# Patient Record
Sex: Male | Born: 1970 | Race: Black or African American | Hispanic: No | Marital: Single | Smoking: Current every day smoker
Health system: Southern US, Community
[De-identification: ages and names within clinical notes are randomized; demographics above are authoritative.]

---

## 2004-03-04 ENCOUNTER — Emergency Department: Payer: Self-pay | Admitting: Emergency Medicine

## 2005-08-10 ENCOUNTER — Emergency Department: Payer: Self-pay | Admitting: Emergency Medicine

## 2005-10-30 ENCOUNTER — Emergency Department: Payer: Self-pay | Admitting: Emergency Medicine

## 2005-11-11 ENCOUNTER — Emergency Department: Payer: Self-pay | Admitting: Unknown Physician Specialty

## 2006-01-20 ENCOUNTER — Emergency Department: Payer: Self-pay | Admitting: Internal Medicine

## 2006-03-12 ENCOUNTER — Emergency Department: Payer: Self-pay | Admitting: Internal Medicine

## 2006-11-22 ENCOUNTER — Emergency Department: Payer: Self-pay | Admitting: Unknown Physician Specialty

## 2007-05-16 ENCOUNTER — Emergency Department: Payer: Self-pay | Admitting: Emergency Medicine

## 2008-02-03 ENCOUNTER — Emergency Department: Payer: Self-pay | Admitting: Emergency Medicine

## 2008-02-07 ENCOUNTER — Emergency Department: Payer: Self-pay | Admitting: Emergency Medicine

## 2008-03-17 ENCOUNTER — Emergency Department: Payer: Self-pay | Admitting: Internal Medicine

## 2008-10-19 ENCOUNTER — Emergency Department: Payer: Self-pay | Admitting: Internal Medicine

## 2008-11-27 ENCOUNTER — Emergency Department: Payer: Self-pay | Admitting: Emergency Medicine

## 2008-12-17 ENCOUNTER — Emergency Department: Payer: Self-pay | Admitting: Emergency Medicine

## 2008-12-19 ENCOUNTER — Ambulatory Visit: Payer: Self-pay | Admitting: Internal Medicine

## 2010-10-10 ENCOUNTER — Emergency Department: Payer: Self-pay | Admitting: Emergency Medicine

## 2012-09-23 ENCOUNTER — Emergency Department: Payer: Self-pay | Admitting: Emergency Medicine

## 2012-12-06 ENCOUNTER — Emergency Department: Payer: Self-pay | Admitting: Emergency Medicine

## 2012-12-07 LAB — GC/CHLAMYDIA PROBE AMP

## 2013-05-20 ENCOUNTER — Emergency Department: Payer: Self-pay | Admitting: Emergency Medicine

## 2014-07-03 ENCOUNTER — Emergency Department: Payer: Self-pay | Admitting: Emergency Medicine

## 2014-09-20 ENCOUNTER — Emergency Department: Admit: 2014-09-20 | Discharge status: Against Medical Advice | Payer: Self-pay | Admitting: Emergency Medicine

## 2014-09-20 LAB — BASIC METABOLIC PANEL
Anion Gap: 9 (ref 7–16)
BUN: 7 mg/dL
CALCIUM: 8.8 mg/dL — AB
CHLORIDE: 105 mmol/L
Co2: 25 mmol/L
Creatinine: 0.79 mg/dL
EGFR (Non-African Amer.): >60
GLUCOSE: 99 mg/dL
Potassium: 3.6 mmol/L
Sodium: 139 mmol/L

## 2014-09-20 LAB — CBC WITH DIFFERENTIAL/PLATELET
BASOS PCT: 1 %
Basophil #: 0.1 10*3/uL (ref 0.0–0.1)
EOS PCT: 1.5 %
Eosinophil #: 0.1 10*3/uL (ref 0.0–0.7)
HCT: 48.8 % (ref 40.0–52.0)
HGB: 16.8 g/dL (ref 13.0–18.0)
LYMPHS PCT: 32.7 %
Lymphocyte #: 2.7 10*3/uL (ref 1.0–3.6)
MCH: 29.4 pg (ref 26.0–34.0)
MCHC: 34.4 g/dL (ref 32.0–36.0)
MCV: 85 fL (ref 80–100)
MONOS PCT: 7.9 %
Monocyte #: 0.7 x10 3/mm (ref 0.2–1.0)
Neutrophil #: 4.7 10*3/uL (ref 1.4–6.5)
Neutrophil %: 56.9 %
Platelet: 227 10*3/uL (ref 150–440)
RBC: 5.72 10*6/uL (ref 4.40–5.90)
RDW: 14.6 % — ABNORMAL HIGH (ref 11.5–14.5)
WBC: 8.3 10*3/uL (ref 3.8–10.6)

## 2014-09-20 LAB — TROPONIN I: Troponin-I: <0.03 ng/mL

## 2014-09-24 ENCOUNTER — Emergency Department
Admit: 2014-09-24 | Disposition: A | Discharge status: Home / Self Care | Payer: Self-pay | Admitting: Emergency Medicine

## 2015-08-03 ENCOUNTER — Encounter: Payer: Self-pay | Admitting: Emergency Medicine

## 2015-08-03 ENCOUNTER — Emergency Department
Admission: EM | Admit: 2015-08-03 | Discharge: 2015-08-03 | Disposition: A | Discharge status: Home / Self Care | Payer: Medicare Other | Attending: Emergency Medicine | Admitting: Emergency Medicine

## 2015-08-03 DIAGNOSIS — L089 Local infection of the skin and subcutaneous tissue, unspecified: Secondary | ICD-10-CM | POA: Diagnosis not present

## 2015-08-03 DIAGNOSIS — R030 Elevated blood-pressure reading, without diagnosis of hypertension: Secondary | ICD-10-CM | POA: Diagnosis not present

## 2015-08-03 DIAGNOSIS — F17210 Nicotine dependence, cigarettes, uncomplicated: Secondary | ICD-10-CM | POA: Insufficient documentation

## 2015-08-03 DIAGNOSIS — L989 Disorder of the skin and subcutaneous tissue, unspecified: Secondary | ICD-10-CM | POA: Diagnosis present

## 2015-08-03 DIAGNOSIS — B9689 Other specified bacterial agents as the cause of diseases classified elsewhere: Secondary | ICD-10-CM

## 2015-08-03 MED ORDER — OXYCODONE-ACETAMINOPHEN 5-325 MG PO TABS: 1.0000 | ORAL_TABLET | Freq: Four times a day (QID) | ORAL | Status: DC | PRN

## 2015-08-03 MED ORDER — SULFAMETHOXAZOLE-TRIMETHOPRIM 800-160 MG PO TABS: 1.0000 | ORAL_TABLET | Freq: Two times a day (BID) | ORAL | Status: DC

## 2015-08-03 NOTE — ED Provider Notes (Signed)
[facility] Emergency Department Provider Note  ____________________________________________  Time seen: Approximately 12:19 PM  I have reviewed the triage vital signs and the nursing notes.   HISTORY  Chief Complaint Abscess    HPI Howard Jennings is a 45 y.o. male patient complaining of a knot in his right groin area. He states for 2-3 days he has noticed drainage from the area. Patient denies any fever or chills associated with this complaint. No palliative measures taken for this complaint.   History reviewed. No pertinent past medical history.  There are no active problems to display for this patient.   History reviewed. No pertinent past surgical history.  Current Outpatient Rx  Name  Route  Sig  Dispense  Refill  . oxyCODONE-acetaminophen (ROXICET) 5-325 MG tablet   Oral   Take 1 tablet by mouth every 6 (six) hours as needed for moderate pain.   12 tablet   0   . sulfamethoxazole-trimethoprim (BACTRIM DS,SEPTRA DS) 800-160 MG tablet   Oral   Take 1 tablet by mouth 2 (two) times daily.   20 tablet   0     Allergies Review of patient's allergies indicates no known allergies.  History reviewed. No pertinent family history.  Social History Social History  Substance Use Topics  . Smoking status: Current Every Day Smoker -- 0.50 packs/day    Types: Cigarettes  . Smokeless tobacco: None  . Alcohol Use: No    Review of Systems Constitutional: No fever/chills Eyes: No visual changes. ENT: No sore throat. Cardiovascular: Denies chest pain. Respiratory: Denies shortness of breath. Gastrointestinal: No abdominal pain.  No nausea, no vomiting.  No diarrhea.  No constipation. Genitourinary: Negative for dysuria. Musculoskeletal: Negative for back pain. Skin: Negative for rash. Nodule lesion right side of scrotum Neurological: Negative for headaches, focal weakness or  numbness.    ____________________________________________   PHYSICAL EXAM:  VITAL SIGNS: ED Triage Vitals  Enc Vitals Group     BP 08/03/15 1120 174/114 mmHg     Pulse Rate 08/03/15 1120 90     Resp 08/03/15 1120 18     Temp 08/03/15 1120 98.3 F (36.8 C)     Temp src --      SpO2 08/03/15 1120 94 %     Weight --      Height --      Head Cir --      Peak Flow --      Pain Score 08/03/15 1117 10     Pain Loc --      Pain Edu? --      Excl. in [place]? --     Constitutional: Alert and oriented. Well appearing and in no acute distress. Eyes: Conjunctivae are normal. PERRL. EOMI. Head: Atraumatic. Nose: No congestion/rhinnorhea. Mouth/Throat: Mucous membranes are moist.  Oropharynx non-erythematous. Neck: No stridor. No cervical spine tenderness to palpation. Hematological/Lymphatic/Immunilogical: No cervical lymphadenopathy. Cardiovascular: Normal rate, regular rhythm. Grossly normal heart sounds.  Good peripheral circulation. Elevated blood pressure Respiratory: Normal respiratory effort.  No retractions. Lungs CTAB. Gastrointestinal: Soft and nontender. No distention. No abdominal bruits. No CVA tenderness. Musculoskeletal: No lower extremity tenderness nor edema.  No joint effusions. Neurologic:  Normal speech and language. No gross focal neurologic deficits are appreciated. No gait instability. Skin:  Skin is warm, dry and intact. No rash noted. Ruptured nodule lesion right side of scrotum. Psychiatric: Mood and affect are normal. Speech and behavior are normal.  ____________________________________________   LABS (all labs ordered  are listed, but only abnormal results are displayed)  Labs Reviewed - No data to display ____________________________________________  EKG   ____________________________________________  RADIOLOGY   ____________________________________________   PROCEDURES  Procedure(s) performed: None  Critical Care performed:  No  ____________________________________________   INITIAL IMPRESSION / ASSESSMENT AND PLAN / ED COURSE  Pertinent labs & imaging results that were available during my care of the patient were reviewed by me and considered in my medical decision making (see chart for details).  Bacterial skin infection. Patient given discharge care instructions. Patient given prescription for tramadol and Bactrim DS. Patient advised return to ER if condition worsens. Advised 3 day [facility]. ____________________________________________   FINAL CLINICAL IMPRESSION(S) / ED DIAGNOSES  Final diagnoses:  Localized bacterial skin infection      Sable Feil, PA-C 08/03/15 1231  Eula Listen, MD 08/03/15 1534

## 2015-08-03 NOTE — ED Notes (Signed)
Pt reports 2-3 days of abscess in groin.

## 2015-08-03 NOTE — ED Notes (Signed)
Pt reports pain and "knot" in his right groin area. Pt states it has been there for 2-3 days. C/O pain 10/10, requests medication for pain. Pt alert & oriented with NAD noted. Pt sitting quietly in chair.

## 2015-08-03 NOTE — Discharge Instructions (Signed)
Apply warm compresses to the area 4 times a day for approximately 5 minutes.

## 2016-03-01 IMAGING — CR FACIAL BONES - 1-2 VIEW
1 series · 3 of 3 positions shown · non-contrast
Comparison: None.

CLINICAL DATA: Fall. Head struck on air condition a unit. Upper
maxillary pain.

EXAM:
FACIAL BONES - 1-2 VIEW

[Series 1: dxr facial bones limited · 0.14mm/px · 3 of 3 slices shown]
[im 1/3]
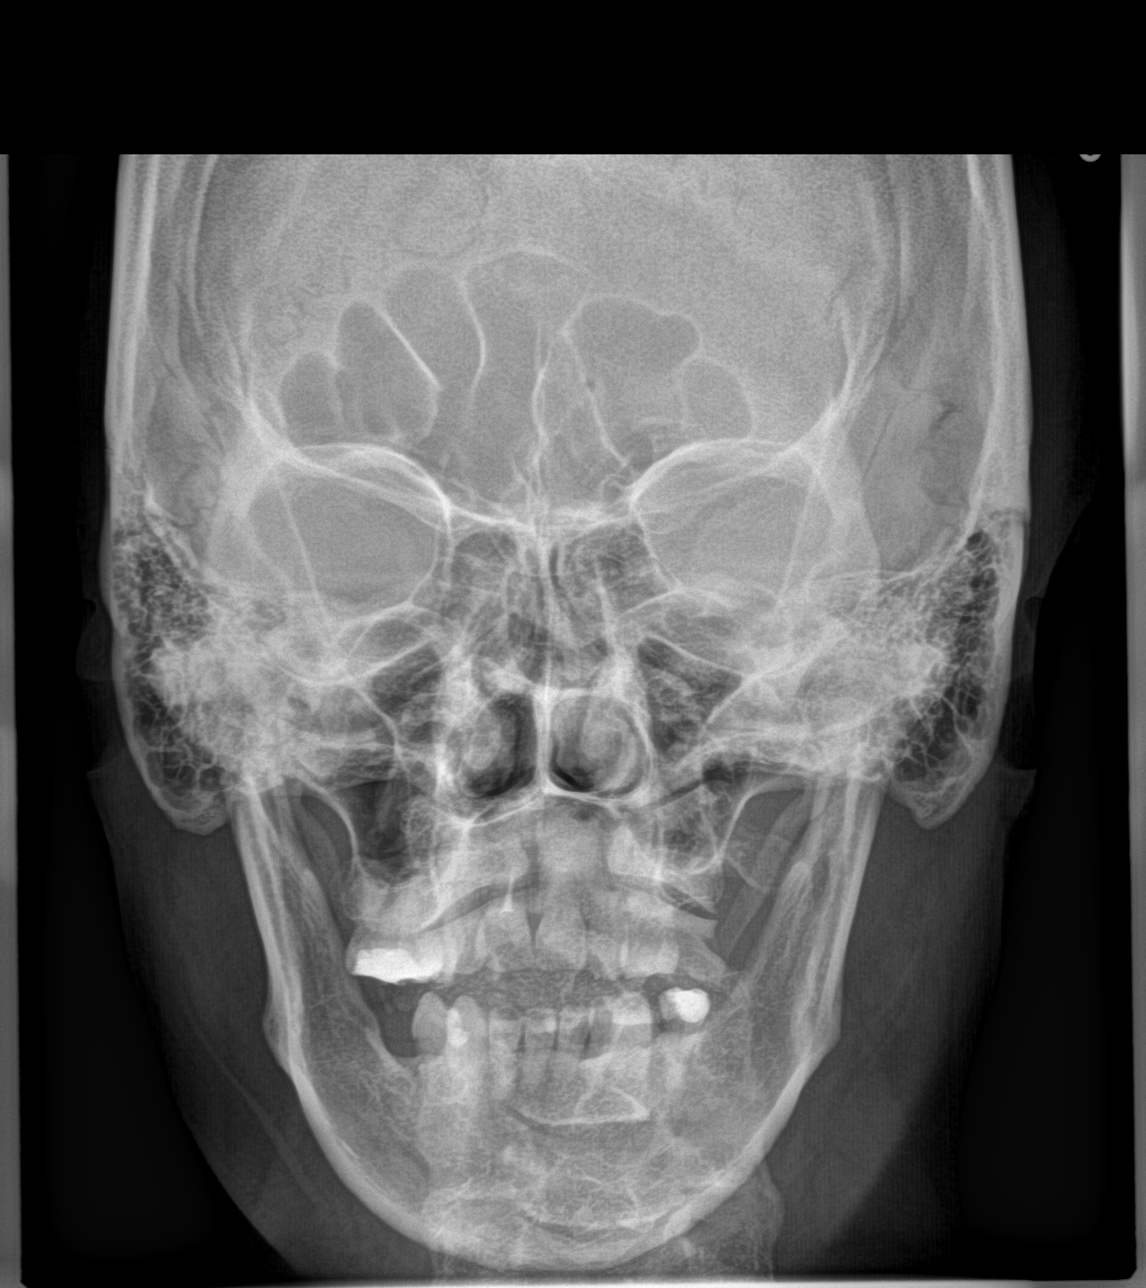
[im 2/3]
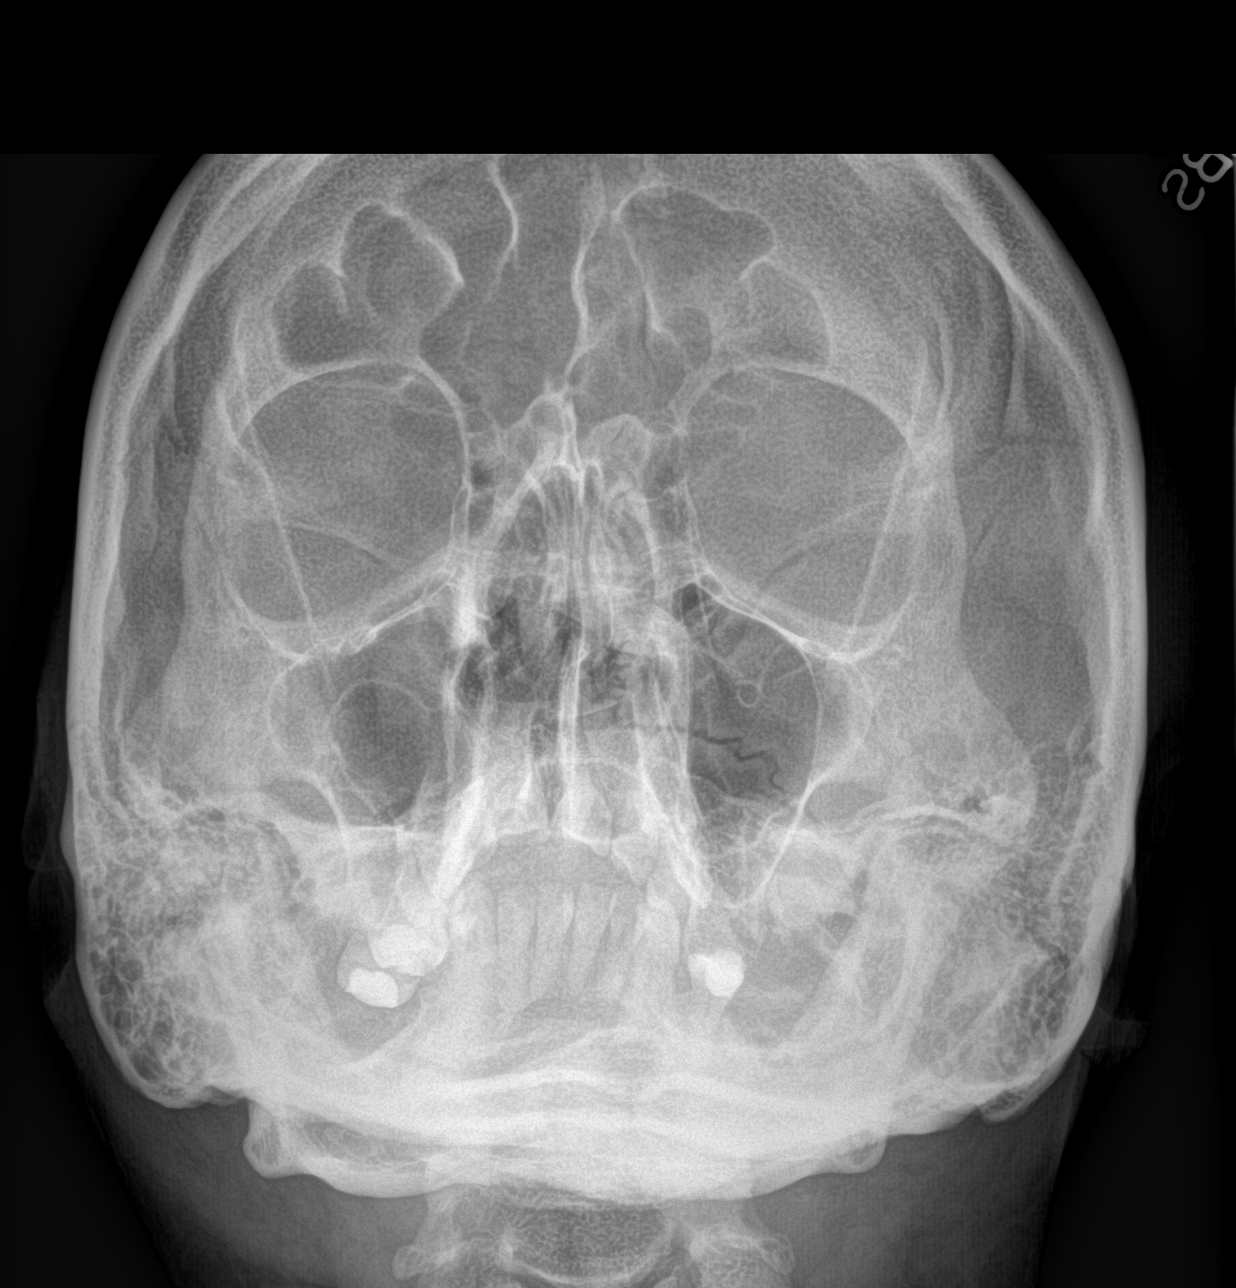
[im 3/3]
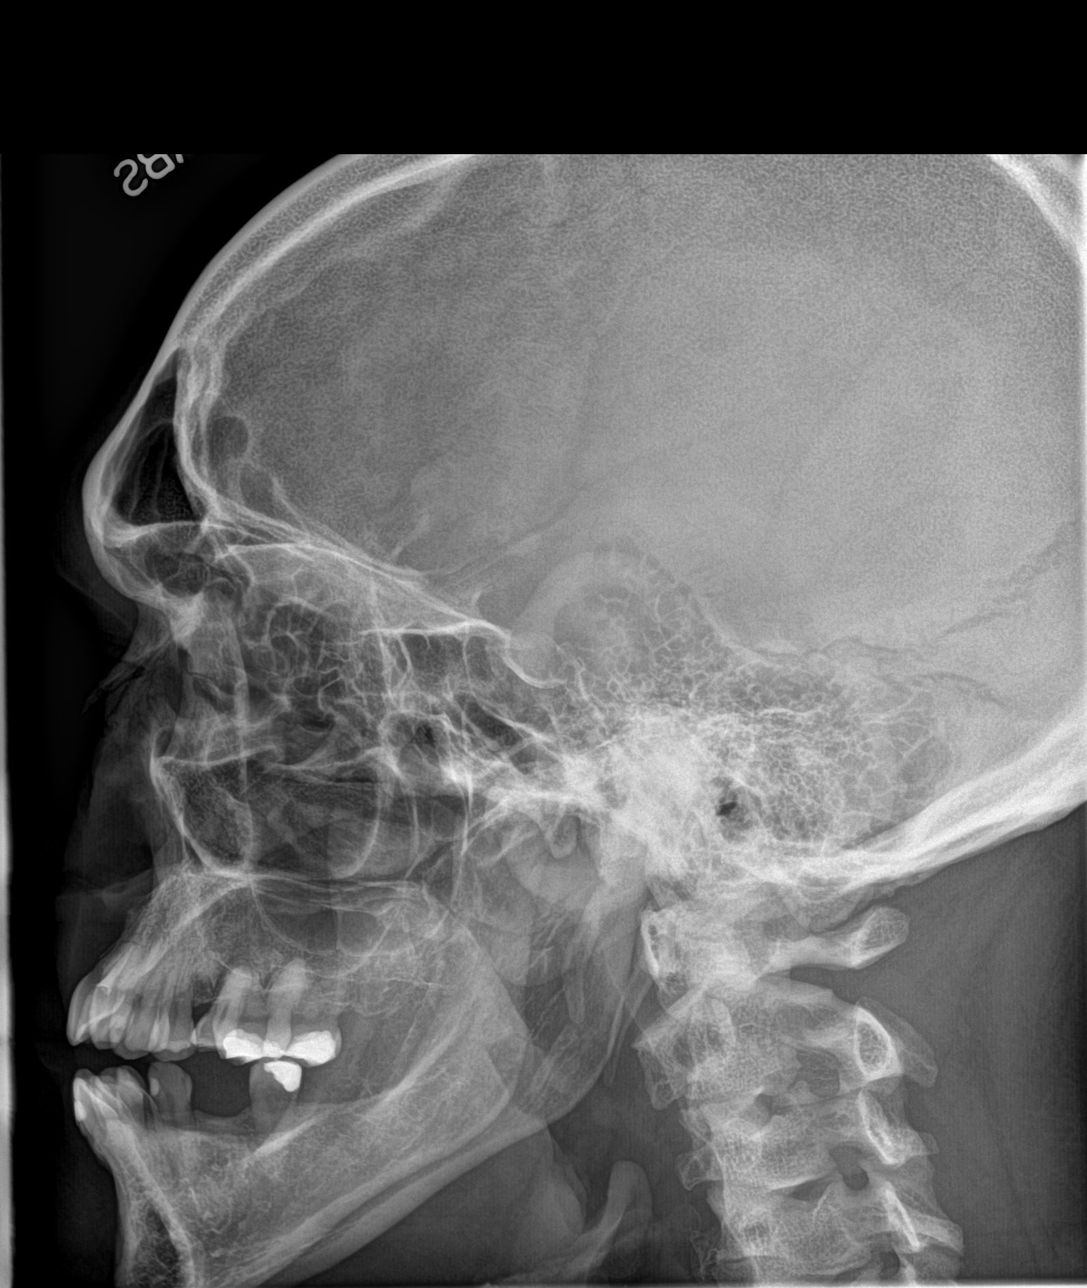

[3 of 3 positions shown; findings below may reference images not displayed]

FINDINGS: No visible fluid in the paranasal sinuses. Lucencies project over
the nasal bones on the lateral projection and are essentially
indeterminate for fracture. No other bony discontinuity observed.
IMPRESSION: 1. Lucencies along the nasal bone are probably technique related,
and less likely to be due to fracture.
2. Although I do not see a definite maxillary fracture, facial
radiographs have dramatically lower sensitivity for facial fractures
as compared to maxillofacial CT.

## 2016-04-30 IMAGING — CR DG LUMBAR SPINE 2-3V
1 series · 3 of 3 positions shown · non-contrast
Comparison: None.

CLINICAL DATA: Lower back pain radiating to right leg for 2 days.
Initial encounter.

EXAM:
LUMBAR SPINE - 2-3 VIEW

[Series 1: t lumbar spine ap · 0.14mm/px · 3 of 3 slices shown]
[im 1/3]
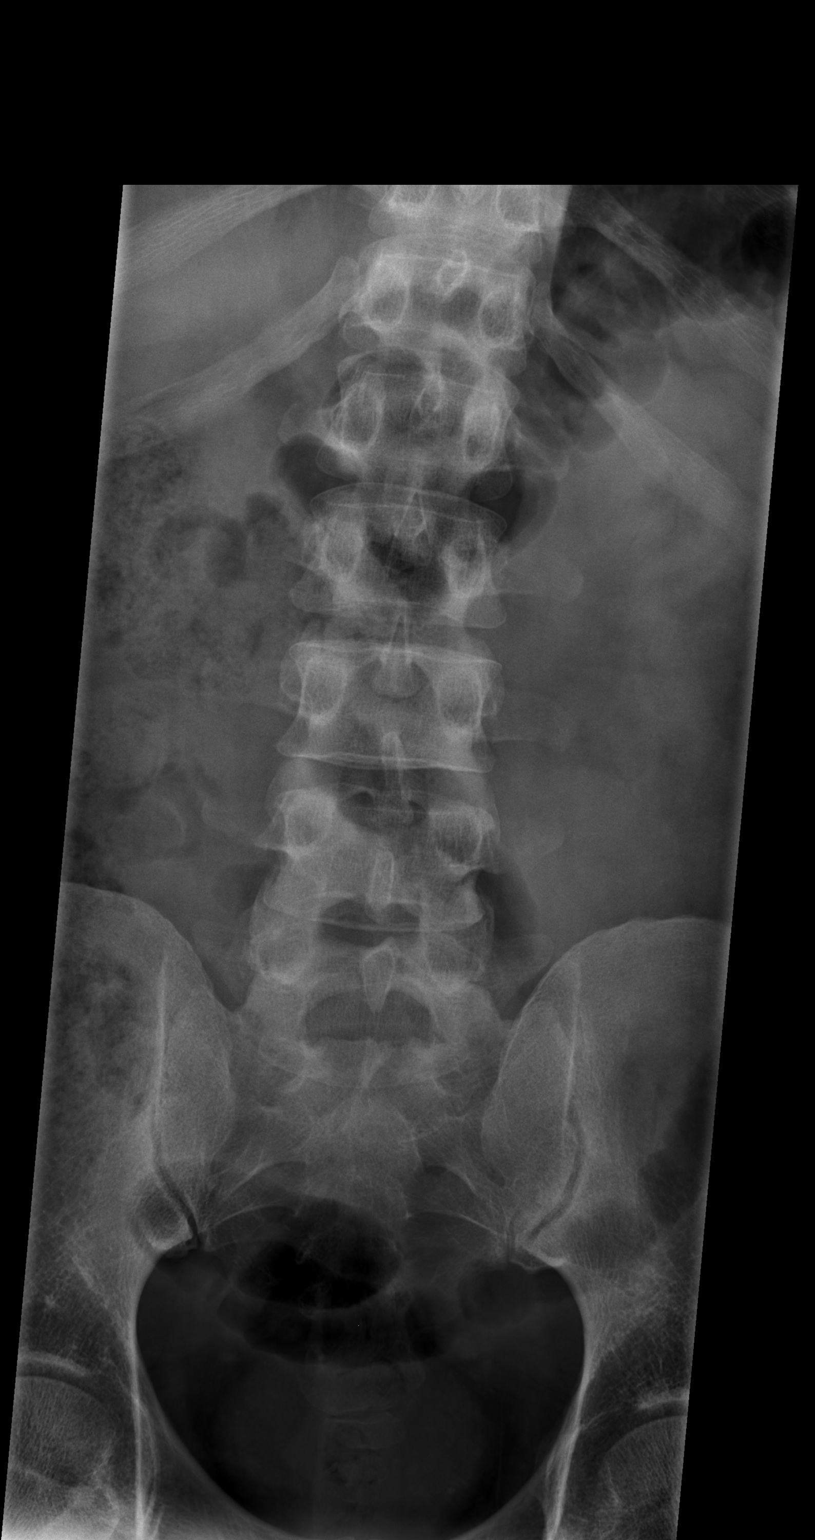
[im 2/3]
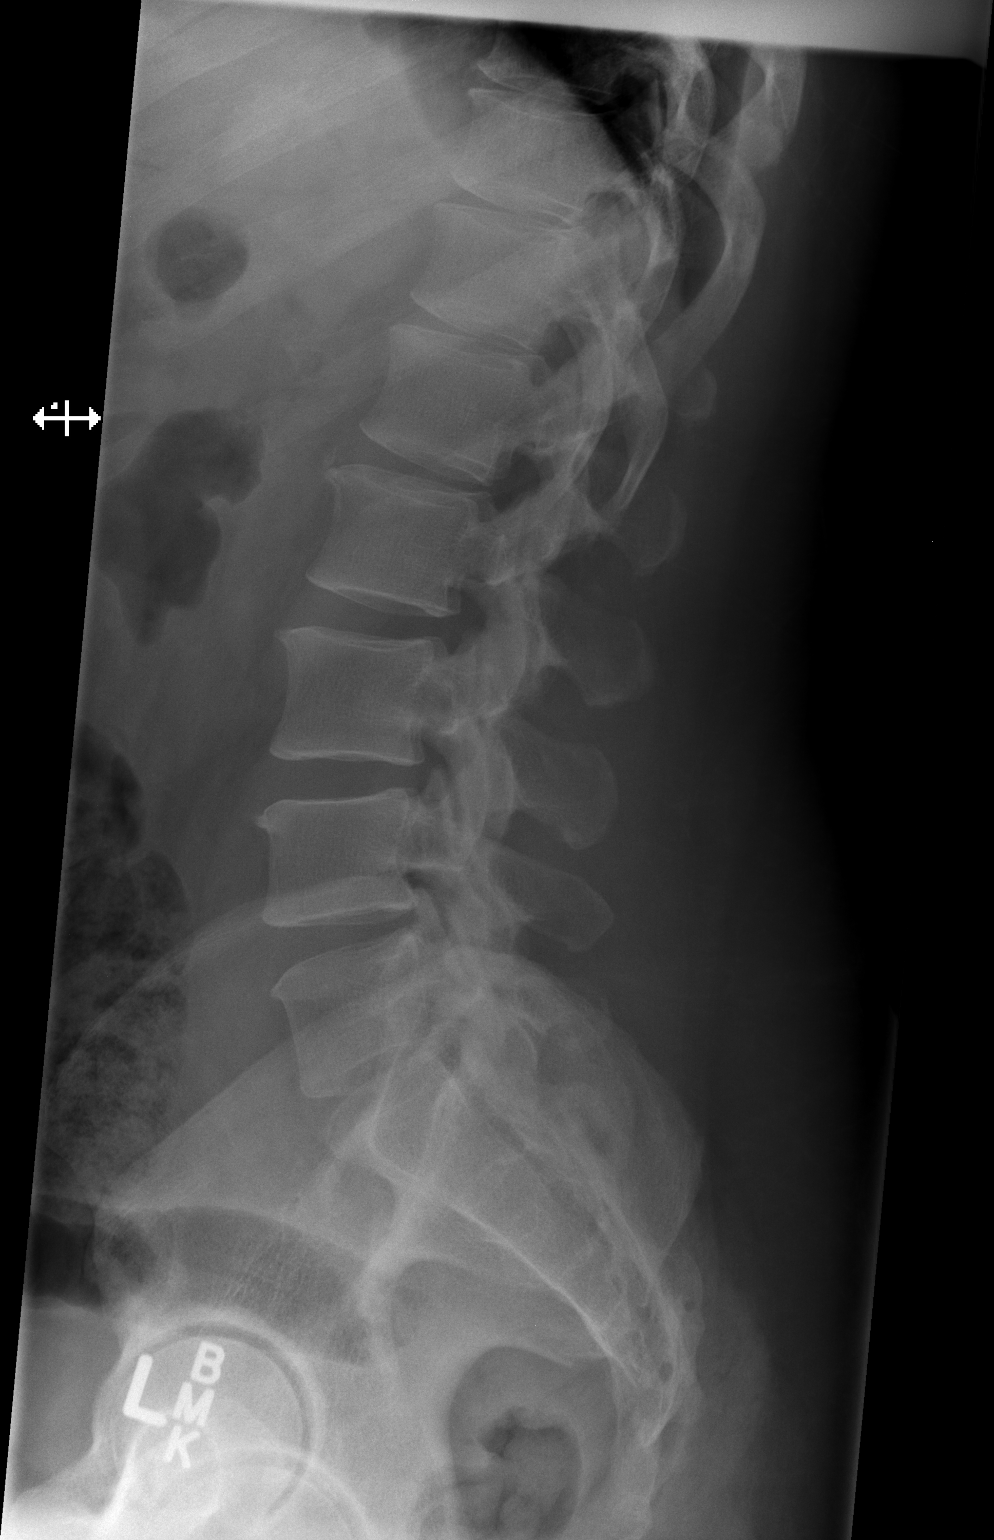
[im 3/3]
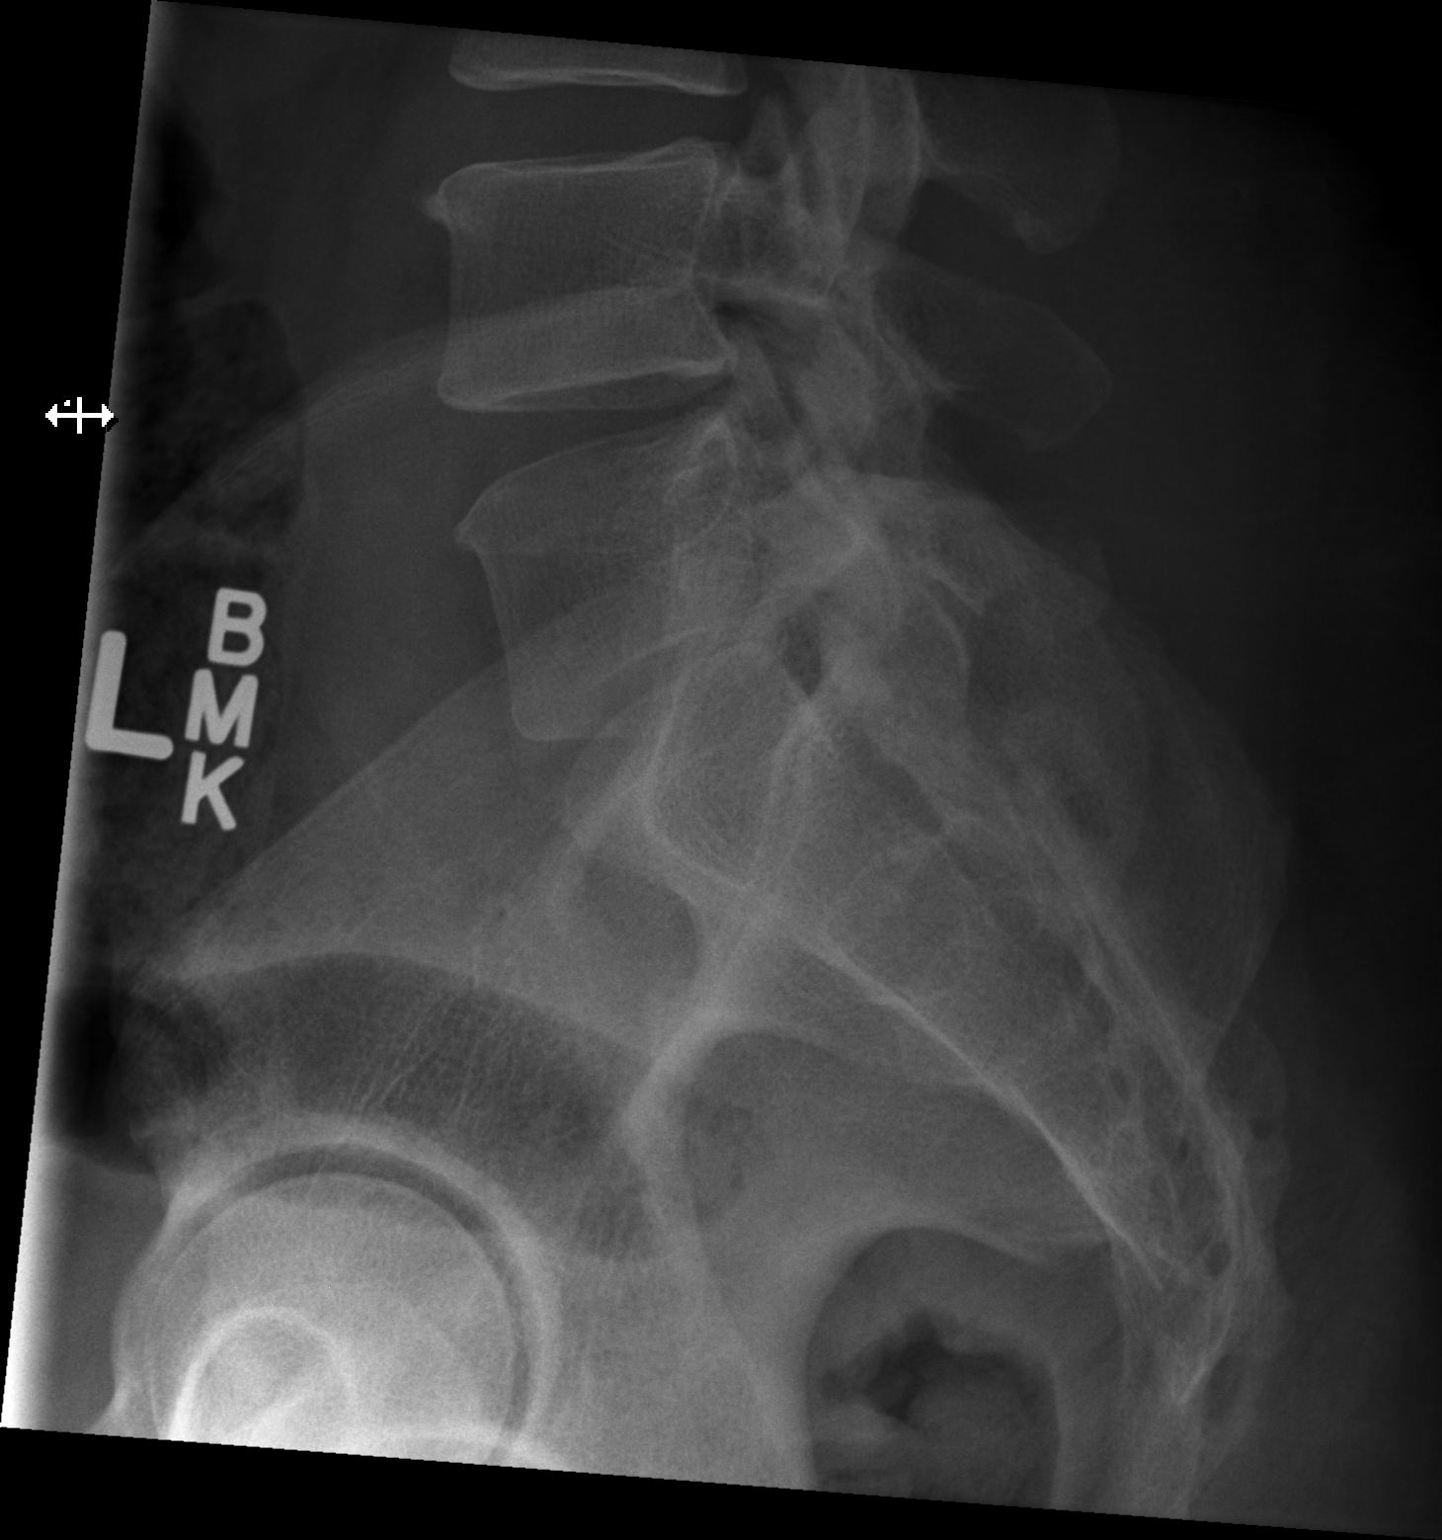

[3 of 3 positions shown; findings below may reference images not displayed]

FINDINGS: The alignment is maintained. Vertebral body heights are normal.
There is no listhesis. The posterior elements are intact. There is
mild disc space narrowing at L4-L5. Small multilevel endplate spurs.
No fracture. Sacroiliac joints are symmetric and normal.
IMPRESSION: Mild degenerative disc disease most prominent at L4-L5.

## 2021-12-01 ENCOUNTER — Encounter: Payer: Self-pay | Admitting: Nurse Practitioner

## 2021-12-01 ENCOUNTER — Ambulatory Visit (INDEPENDENT_AMBULATORY_CARE_PROVIDER_SITE_OTHER): Payer: Medicare Other | Admitting: Nurse Practitioner

## 2021-12-01 VITALS — BP 132/86 | HR 98 | Temp 98.7°F | Resp 18 | Ht 66.5 in | Wt 237.2 lb

## 2021-12-01 DIAGNOSIS — Z8679 Personal history of other diseases of the circulatory system: Secondary | ICD-10-CM | POA: Diagnosis not present

## 2021-12-01 DIAGNOSIS — Z1159 Encounter for screening for other viral diseases: Secondary | ICD-10-CM

## 2021-12-01 DIAGNOSIS — Z13220 Encounter for screening for lipoid disorders: Secondary | ICD-10-CM

## 2021-12-01 DIAGNOSIS — Z131 Encounter for screening for diabetes mellitus: Secondary | ICD-10-CM

## 2021-12-01 DIAGNOSIS — F17200 Nicotine dependence, unspecified, uncomplicated: Secondary | ICD-10-CM

## 2021-12-01 DIAGNOSIS — E669 Obesity, unspecified: Secondary | ICD-10-CM | POA: Diagnosis not present

## 2021-12-01 DIAGNOSIS — Z114 Encounter for screening for human immunodeficiency virus [HIV]: Secondary | ICD-10-CM

## 2021-12-01 DIAGNOSIS — Z1211 Encounter for screening for malignant neoplasm of colon: Secondary | ICD-10-CM

## 2021-12-01 DIAGNOSIS — Z7689 Persons encountering health services in other specified circumstances: Secondary | ICD-10-CM | POA: Diagnosis not present

## 2021-12-01 DIAGNOSIS — Z130 Encounter for screening for diseases of the blood and blood-forming organs and certain disorders involving the immune mechanism: Secondary | ICD-10-CM

## 2021-12-01 LAB — CBC WITH DIFFERENTIAL/PLATELET
Absolute Monocytes: 608 cells/uL (ref 200–950)
Basophils Absolute: 57 cells/uL (ref 0–200)
Eosinophils Absolute: 97 cells/uL (ref 15–500)
HCT: 50.3 % — ABNORMAL HIGH (ref 38.5–50.0)
Hemoglobin: 17.5 g/dL — ABNORMAL HIGH (ref 13.2–17.1)
Lymphs Abs: 2122 cells/uL (ref 850–3900)
MCH: 29.8 pg (ref 27.0–33.0)
MCHC: 34.8 g/dL (ref 32.0–36.0)
MCV: 85.5 fL (ref 80.0–100.0)
MPV: 11.4 fL (ref 7.5–12.5)
Neutro Abs: 5216 cells/uL (ref 1500–7800)
Neutrophils Relative %: 64.4 %
RBC: 5.88 10*6/uL — ABNORMAL HIGH (ref 4.20–5.80)
RDW: 13.9 % (ref 11.0–15.0)
WBC: 8.1 10*3/uL (ref 3.8–10.8)

## 2021-12-01 NOTE — Assessment & Plan Note (Signed)
Patient weight is 237 pounds with a BMI of 37.71.  Try working on lifestyle changes.

## 2021-12-01 NOTE — Progress Notes (Signed)
BP 132/86   Pulse 98   Temp 98.7 F (37.1 C)   Resp 18   Ht 5' 6.5" (1.689 m)   Wt 237 lb 3.2 oz (107.6 kg)   SpO2 98%   BMI 37.71 kg/m    Subjective:    Patient ID: Howard Jennings, male    DOB: Sep 30, 1970, 51 y.o.   MRN: [id number]  HPI: [id number] Jennings is a 51 y.o. male  Chief Complaint  Patient presents with   Establish Care   Establish care: Patient reports his last physical was several years ago.  He reports that no medical problems and does not take any medications.  He denies any surgeries.   Tobacco dependence:  He says he smokes about 1/2 pack a day. He says that he has been a smoker for a long time.  He denies any shortness of breath.  He has no interest in quitting at this time.  Hypertension: He says he used to be on medications about a year ago for high blood pressure. He says he used to be on lisinopril. Patient states he stopped taking it.  He says he feels good.  Blood pressure today is 132/86. He denies any chest pain, shortness of breath, headaches or blurred vision.   Obesity: Patient's weight today is 237 pounds with a BMI of 37.71.  Recommend working on lifestyle changes like portion control and physical activity.     12/01/2021    1:38 PM  Depression screen PHQ 2/9  Decreased Interest 0  Down, Depressed, Hopeless 0  PHQ - 2 Score 0  Altered sleeping 0  Tired, decreased energy 0  Change in appetite 0  Feeling bad or failure about yourself  0  Trouble concentrating 0  Moving slowly or fidgety/restless 0  Suicidal thoughts 0  PHQ-9 Score 0  Difficult doing work/chores Not difficult at all     Relevant past medical, surgical, family and social history reviewed and updated as indicated. Interim medical history since our last visit reviewed. Allergies and medications reviewed and updated.  Review of Systems  Constitutional: Negative for fever or weight change.  Respiratory: Negative for cough and shortness of breath.   Cardiovascular:  Negative for chest pain or palpitations.  Gastrointestinal: Negative for abdominal pain, no bowel changes.  Musculoskeletal: Negative for gait problem or joint swelling.  Skin: Negative for rash.  Neurological: Negative for dizziness or headache.  No other specific complaints in a complete review of systems (except as listed in HPI above).      Objective:    BP 132/86   Pulse 98   Temp 98.7 F (37.1 C)   Resp 18   Ht 5' 6.5" (1.689 m)   Wt 237 lb 3.2 oz (107.6 kg)   SpO2 98%   BMI 37.71 kg/m   Wt Readings from Last 3 Encounters:  12/01/21 237 lb 3.2 oz (107.6 kg)    Physical Exam  Constitutional: Patient appears well-developed and well-nourished. Obese  No distress.  HEENT: head atraumatic, normocephalic, pupils equal and reactive to light,  neck supple Cardiovascular: Normal rate, regular rhythm and normal heart sounds.  No murmur heard. No BLE edema. Pulmonary/Chest: Effort normal and breath sounds normal. No respiratory distress. Abdominal: Soft.  There is no tenderness. Psychiatric: Patient has a normal mood and affect. behavior is normal. Judgment and thought content normal.     Assessment & Plan:   Problem List Items Addressed This Visit       Other  History of hypertension - Primary    Patient reports he was told that he had hypertension a long time ago.  Patient states he was on lisinopril but he stopped taking it over a year ago.  His blood pressure today is 132/86.      Relevant Orders   CBC with Differential/Platelet   COMPLETE METABOLIC PANEL WITH GFR   Tobacco dependence    Patient reports that on average she smokes about a half a pack a day.  Patient has no interest in quitting at this time.      Obesity (BMI 30-39.9)    Patient weight is 237 pounds with a BMI of 37.71.  Try working on lifestyle changes.      Other Visit Diagnoses     Encounter to establish care       Schedule CPE   Relevant Orders   Lipid panel   CBC with  Differential/Platelet   COMPLETE METABOLIC PANEL WITH GFR   Hemoglobin A1c   Hepatitis C antibody   HIV Antibody (routine testing w rflx)   Cologuard   Screening for deficiency anemia       Relevant Orders   CBC with Differential/Platelet   Encounter for hepatitis C screening test for low risk patient       Relevant Orders   Hepatitis C antibody   Screening for HIV without presence of risk factors       Relevant Orders   HIV Antibody (routine testing w rflx)   Screening for diabetes mellitus       Relevant Orders   COMPLETE METABOLIC PANEL WITH GFR   Hemoglobin A1c   Screening for cholesterol level       Relevant Orders   Lipid panel   Screening for colon cancer       Relevant Orders   Cologuard        Follow up plan: Return in about 6 months (around 06/03/2022) for cpe.

## 2021-12-01 NOTE — Assessment & Plan Note (Signed)
Patient reports he was told that he had hypertension a long time ago.  Patient states he was on lisinopril but he stopped taking it over a year ago.  His blood pressure today is 132/86.

## 2021-12-01 NOTE — Assessment & Plan Note (Signed)
Patient reports that on average she smokes about a half a pack a day.  Patient has no interest in quitting at this time.

## 2021-12-02 LAB — COMPLETE METABOLIC PANEL WITH GFR
AG Ratio: 1.4 (calc) (ref 1.0–2.5)
ALT: 19 U/L (ref 9–46)
AST: 15 U/L (ref 10–35)
Albumin: 4.2 g/dL (ref 3.6–5.1)
Alkaline phosphatase (APISO): 75 U/L (ref 35–144)
BUN: 13 mg/dL (ref 7–25)
CO2: 23 mmol/L (ref 20–32)
Calcium: 9.3 mg/dL (ref 8.6–10.3)
Chloride: 109 mmol/L (ref 98–110)
Creat: 0.95 mg/dL (ref 0.70–1.30)
Globulin: 3.1 g/dL (calc) (ref 1.9–3.7)
Glucose, Bld: 94 mg/dL (ref 65–99)
Potassium: 4.1 mmol/L (ref 3.5–5.3)
Sodium: 143 mmol/L (ref 135–146)
Total Bilirubin: 0.4 mg/dL (ref 0.2–1.2)
Total Protein: 7.3 g/dL (ref 6.1–8.1)
eGFR: 97 mL/min/{1.73_m2} (ref >=60)

## 2021-12-02 LAB — LIPID PANEL
Cholesterol: 191 mg/dL (ref <200)
HDL: 62 mg/dL (ref >=40)
LDL Cholesterol (Calc): 110 mg/dL (calc) — ABNORMAL HIGH
Non-HDL Cholesterol (Calc): 129 mg/dL (calc) (ref <130)
Total CHOL/HDL Ratio: 3.1 (calc) (ref <5.0)
Triglycerides: 93 mg/dL (ref <150)

## 2021-12-02 LAB — HEMOGLOBIN A1C
Hgb A1c MFr Bld: 5.2 % of total Hgb (ref <5.7)
Mean Plasma Glucose: 103 mg/dL
eAG (mmol/L): 5.7 mmol/L

## 2021-12-02 LAB — HIV ANTIBODY (ROUTINE TESTING W REFLEX): HIV 1&2 Ab, 4th Generation: NONREACTIVE

## 2021-12-02 LAB — CBC WITH DIFFERENTIAL/PLATELET
Basophils Relative: 0.7 %
Eosinophils Relative: 1.2 %
Monocytes Relative: 7.5 %
Platelets: 254 10*3/uL (ref 140–400)
Total Lymphocyte: 26.2 %

## 2021-12-02 LAB — HEPATITIS C ANTIBODY: Hepatitis C Ab: NONREACTIVE

## 2021-12-07 ENCOUNTER — Telehealth: Payer: Self-pay | Admitting: Nurse Practitioner

## 2021-12-07 NOTE — Telephone Encounter (Signed)
Spoke to aunt on [employer] about disability paperwork to be filled out. Explained to her there is no prior records on patient since 2017. To reach out to where he was a patient before here

## 2021-12-07 NOTE — Telephone Encounter (Unsigned)
Copied from [place] [id number]. Topic: General - Other >> Dec 07, 2021 10:09 AM Chapman Fitch wrote: Reason for CRM: Pts aunt called to speak with Almyra Free about the pt and a medication / please advise

## 2021-12-10 ENCOUNTER — Other Ambulatory Visit: Payer: Self-pay | Admitting: Nurse Practitioner

## 2022-03-09 ENCOUNTER — Emergency Department: Payer: Medicare Other

## 2022-03-09 ENCOUNTER — Emergency Department
Admission: EM | Admit: 2022-03-09 | Discharge: 2022-03-09 | Disposition: A | Discharge status: Home / Self Care | Payer: Medicare Other | Attending: Emergency Medicine | Admitting: Emergency Medicine

## 2022-03-09 DIAGNOSIS — W450XXA Nail entering through skin, initial encounter: Secondary | ICD-10-CM | POA: Insufficient documentation

## 2022-03-09 DIAGNOSIS — I10 Essential (primary) hypertension: Secondary | ICD-10-CM | POA: Insufficient documentation

## 2022-03-09 DIAGNOSIS — S91331A Puncture wound without foreign body, right foot, initial encounter: Secondary | ICD-10-CM

## 2022-03-09 DIAGNOSIS — Z720 Tobacco use: Secondary | ICD-10-CM | POA: Insufficient documentation

## 2022-03-09 DIAGNOSIS — S91341A Puncture wound with foreign body, right foot, initial encounter: Secondary | ICD-10-CM | POA: Diagnosis not present

## 2022-03-09 DIAGNOSIS — Z23 Encounter for immunization: Secondary | ICD-10-CM | POA: Diagnosis not present

## 2022-03-09 MED ORDER — CEPHALEXIN 500 MG PO CAPS: 1000.0000 mg | ORAL_CAPSULE | Freq: Two times a day (BID) | ORAL | 0 refills | Status: AC

## 2022-03-09 MED ORDER — OXYCODONE-ACETAMINOPHEN 5-325 MG PO TABS: 1.0000 | ORAL_TABLET | Freq: Three times a day (TID) | ORAL | 0 refills | Status: AC | PRN

## 2022-03-09 MED ORDER — TETANUS-DIPHTH-ACELL PERTUSSIS 5-2.5-18.5 LF-MCG/0.5 IM SUSY
0.5000 mL | PREFILLED_SYRINGE | Freq: Once | INTRAMUSCULAR | Status: AC
  Administered 2022-03-09: 0.5 mL via INTRAMUSCULAR
  Filled 2022-03-09: qty 0.5

## 2022-03-09 MED ORDER — OXYCODONE-ACETAMINOPHEN 5-325 MG PO TABS
1.0000 | ORAL_TABLET | Freq: Once | ORAL | Status: AC
  Administered 2022-03-09: 1 via ORAL
  Filled 2022-03-09: qty 1

## 2022-03-09 NOTE — ED Triage Notes (Signed)
Pt arrives with c/o right foot pain after stepping on a nail. Per pt, he has a small puncture wound.

## 2022-03-09 NOTE — Discharge Instructions (Addendum)
-  Please take the full course of the cephalexin as prescribed.  -You may treat the pain with Tylenol/ibuprofen as needed.  Please utilize oxycodone sparingly and only as needed.  Be careful as it may make you dizzy and drowsy.  -Return to the emergency department anytime if you begin to experience any new or worsening symptoms.

## 2022-03-09 NOTE — ED Provider Notes (Signed)
[facility] Provider Note    Event Date/Time   First MD Initiated Contact with Patient 03/09/22 2136     (approximate)   History   Chief Complaint Foot Injury   HPI Howard Jennings is a 51 y.o. male, history of hypertension, tobacco use, presents emergency department for evaluation of puncture wound to the right foot.  Patient states that he was helping some friends started on boating when he accidentally stepped on a nail, causing a puncture wound to the plantar aspect of the right foot.  He states that since then he has had difficulty walking on his foot.  This event occurred earlier today.  He is unsure if there is any foreign body in there.  Denies fever/chills, chest pain, shortness of breath, cold sensation in the affected foot, numb/tingling, rash/lesions, nausea/vomiting, or dizziness/lightheadedness.  History Limitations: No limitations.        Physical Exam  Triage Vital Signs: ED Triage Vitals [03/09/22 2005]  Enc Vitals Group     BP (!) 150/97     Pulse Rate 86     Resp 16     Temp 98.2 F (36.8 C)     Temp src      SpO2 97 %     Weight 234 lb (106.1 kg)     Height      Head Circumference      Peak Flow      Pain Score 10     Pain Loc      Pain Edu?      Excl. in [place]?     Most recent vital signs: Vitals:   03/09/22 2005  BP: (!) 150/97  Pulse: 86  Resp: 16  Temp: 98.2 F (36.8 C)  SpO2: 97%    General: Awake, NAD.  Skin: Warm, dry. No rashes or lesions.  Eyes: PERRL. Conjunctivae normal.  CV: Good peripheral perfusion.  Resp: Normal effort.  Abd: Soft, non-tender. No distention.  Neuro: At baseline. No gross neurological deficits.  Musculoskeletal: Normal ROM of all extremities.  Focused Exam: Small puncture wound appreciated along the midfoot of the plantar aspect of the right foot.  No active bleeding or discharge.  No obvious foreign bodies.  No surrounding warmth, erythema, or tenderness.  Physical  Exam    ED Results / Procedures / Treatments  Labs (all labs ordered are listed, but only abnormal results are displayed) Labs Reviewed - No data to display   EKG N/A.    RADIOLOGY  ED Provider Interpretation: I personally viewed and interpreted this x-ray, no evidence of acute fractures, dislocations, or foreign bodies.  DG Foot Complete Right  Result Date: 03/09/2022 CLINICAL DATA:  Pain, stepped on a nail EXAM: RIGHT FOOT COMPLETE - 3+ VIEW COMPARISON:  None Available. FINDINGS: No fracture or dislocation is seen. There are no opaque foreign bodies. Minimal bony spurs seen in first metatarsophalangeal joint. There is small faint linear calcific density adjacent to the medial aspect of medial cuneiform. IMPRESSION: No recent fracture or dislocation is seen. Small linear calcific density adjacent to the medial aspect of distal portion of medial cuneiform may suggest old avulsion. Electronically Signed   By: Elmer Picker M.D.   On: 03/09/2022 20:53    PROCEDURES:  Critical Care performed: N/A.  Procedures    MEDICATIONS ORDERED IN ED: Medications  Tdap (BOOSTRIX) injection 0.5 mL (0.5 mLs Intramuscular Given 03/09/22 2150)  oxyCODONE-acetaminophen (PERCOCET/ROXICET) 5-325 MG per tablet 1 tablet (1 tablet Oral Given  03/09/22 2150)     IMPRESSION / MDM / ASSESSMENT AND PLAN / ED COURSE  I reviewed the triage vital signs and the nursing notes.                              Differential diagnosis includes, but is not limited to, puncture wound, tetanus, cellulitis, foreign body.  Assessment/Plan Patient presents with puncture wound along the plantar aspect of the right foot after stepping on a nail.  No evidence of any foreign bodies on physical exam.  X-ray shows no evidence of fractures or dislocations or foreign bodies.  No active bleeding at this time.  Provide him with a tetanus booster.  In addition provide him with a prescription for cephalexin to cover for  infections, as well as a brief prescription for oxycodone/acetaminophen due to the significant pain.  No further work-up indicated at this time.  Encouraged him to wash daily with soap and water and apply topical antibiotic ointment such as Neosporin to the site daily.  He was amenable to this plan.  Will discharge.  Provided the patient with anticipatory guidance, return precautions, and educational material. Encouraged the patient to return to the emergency department at any time if they begin to experience any new or worsening symptoms. Patient expressed understanding and agreed with the plan.   Patient's presentation is most consistent with acute complicated illness / injury requiring diagnostic workup.       FINAL CLINICAL IMPRESSION(S) / ED DIAGNOSES   Final diagnoses:  Puncture wound of right foot, initial encounter     Rx / DC Orders   ED Discharge Orders          Ordered    cephALEXin (KEFLEX) 500 MG capsule  2 times daily        03/09/22 2145    oxyCODONE-acetaminophen (PERCOCET) 5-325 MG tablet  Every 8 hours PRN        03/09/22 2145             Note:  This document was prepared using Dragon voice recognition software and may include unintentional dictation errors.   Teodoro Spray, [place] 03/09/22 2154    Rada Hay, MD 03/10/22 Quentin Mulling

## 2022-03-10 ENCOUNTER — Telehealth: Payer: Self-pay | Admitting: Nurse Practitioner

## 2022-03-10 NOTE — Telephone Encounter (Signed)
Transition Care Management Follow-up Telephone Call Date of discharge and from where: 03/09/2022 [facility] (ED Visit Only) How have you been since you were released from the hospital? better Any questions or concerns? No  Items Reviewed: Did the pt receive and understand the discharge instructions provided? Yes  Medications obtained and verified? Yes  Other? No  Any new allergies since your discharge? No  Dietary orders reviewed? N/A Do you have support at home? Yes   Home Care and Equipment/Supplies: Were home health services ordered? not applicable If so, what is the name of the agency?   Has the agency set up a time to come to the patient's home? not applicable Were any new equipment or medical supplies ordered?  No What is the name of the medical supply agency? N/A Were you able to get the supplies/equipment? not applicable Do you have any questions related to the use of the equipment or supplies? No  Functional Questionnaire: (I = Independent and D = Dependent) ADLs: I  Bathing/Dressing- I  Meal Prep- I  Eating- I  Maintaining continence- I  Transferring/Ambulation- I  Managing Meds- I  Follow up appointments reviewed:  PCP Hospital f/u appt confirmed?  Follow up if symptoms get worse . [facility] f/u appt confirmed?  Not recommended at this time.   Are transportation arrangements needed? No  If their condition worsens, is the pt aware to call PCP or go to the Emergency Dept.? Yes Was the patient provided with contact information for the PCP's office or ED? Yes Was to pt encouraged to call back with questions or concerns? Yes

## 2022-06-07 NOTE — Progress Notes (Unsigned)
Name: Howard Jennings   MRN: [id number]    DOB: Dec 29, 1970   Date:06/07/2022       Progress Note  Subjective  Chief Complaint  No chief complaint on file.   HPI  Patient presents for annual CPE .  IPSS Questionnaire (AUA-7): Over the past month.   1)  How often have you had a sensation of not emptying your bladder completely after you finish urinating?  {Rating:19227}  2)  How often have you had to urinate again less than two hours after you finished urinating? {Rating:19227}  3)  How often have you found you stopped and started again several times when you urinated?  {Rating:19227}  4) How difficult have you found it to postpone urination?  {Rating:19227}  5) How often have you had a weak urinary stream?  {Rating:19227}  6) How often have you had to push or strain to begin urination?  {Rating:19227}  7) How many times did you most typically get up to urinate from the time you went to bed until the time you got up in the morning?  {Rating:19228}  Total score:  0-7 mildly symptomatic   8-19 moderately symptomatic   20-35 severely symptomatic     Diet: *** Exercise: *** Sleep: *** Last dental exam:*** Last eye exam: ***  Depression: phq 9 is {gen pos YOV:[id number]}    12/01/2021    1:38 PM  Depression screen PHQ 2/9  Decreased Interest 0  Down, Depressed, Hopeless 0  PHQ - 2 Score 0  Altered sleeping 0  Tired, decreased energy 0  Change in appetite 0  Feeling bad or failure about yourself  0  Trouble concentrating 0  Moving slowly or fidgety/restless 0  Suicidal thoughts 0  PHQ-9 Score 0  Difficult doing work/chores Not difficult at all    Hypertension:  BP Readings from Last 3 Encounters:  03/09/22 (!) 163/96  12/01/21 132/86  08/03/15 (!) 174/114    Obesity: Wt Readings from Last 3 Encounters:  03/09/22 234 lb (106.1 kg)  12/01/21 237 lb 3.2 oz (107.6 kg)   BMI Readings from Last 3 Encounters:  03/09/22 37.20 kg/m  12/01/21 37.71 kg/m     Lipids:   Lab Results  Component Value Date   CHOL 191 12/01/2021   Lab Results  Component Value Date   HDL 62 12/01/2021   Lab Results  Component Value Date   LDLCALC 110 (H) 12/01/2021   Lab Results  Component Value Date   TRIG 93 12/01/2021   Lab Results  Component Value Date   CHOLHDL 3.1 12/01/2021   No results found for: "LDLDIRECT" Glucose:  Glucose  Date Value Ref Range Status  09/20/2014 99 mg/dL Final    Comment:    65-99 NOTE: New Reference Range  08/05/14    Glucose, Bld  Date Value Ref Range Status  12/01/2021 94 65 - 99 mg/dL Final    Comment:    .            Fasting reference interval .       Single STD testing and prevention (HIV/chl/gon/syphilis): 12/01/2021 Hep C: 12/01/2021  Skin cancer: Discussed monitoring for atypical lesions Colorectal cancer: ordered Prostate cancer: ordered No results found for: "PSA"   Lung cancer:   Low Dose CT Chest recommended if Age 53-80 years, 30 pack-year currently smoking OR have quit w/in 15years. Patient does qualify.   AAA:  The USPSTF recommends one-time screening with ultrasonography in men ages 53 to 16 years who  have ever smoked ECG:  12/17/2008  Vaccines:  HPV: up to at age 53 , ask insurance if age between 27-45  Shingrix: 53-64 yo and ask insurance if covered when patient above 70 yo Pneumonia:  educated and discussed with patient. Flu:  educated and discussed with patient.  Advanced Care Planning: A voluntary discussion about advance care planning including the explanation and discussion of advance directives.  Discussed health care proxy and Living will, and the patient was able to identify a health care proxy as ***.  Patient {DOES_DOES JOA:41660} have a living will at present time. If patient does have living will, I have requested they bring this to the clinic to be scanned in to their chart.  Patient Active Problem List   Diagnosis Date Noted   History of hypertension 12/01/2021   Tobacco  dependence 12/01/2021   Obesity (BMI 30-39.9) 12/01/2021    No past surgical history on file.  Family History  Problem Relation Age of Onset   Diabetes Mother    Kidney disease Mother    Emphysema Father     Social History   Socioeconomic History   Marital status: Single    Spouse name: Not on file   Number of children: Not on file   Years of education: Not on file   Highest education level: Not on file  Occupational History   Not on file  Tobacco Use   Smoking status: Every Day    Packs/day: 0.50    Types: Cigarettes   Smokeless tobacco: Not on file  Vaping Use   Vaping Use: Never used  Substance and Sexual Activity   Alcohol use: Yes    Comment: occ   Drug use: Never   Sexual activity: Yes  Other Topics Concern   Not on file  Social History Narrative   Not on file   Social Determinants of Health   Financial Resource Strain: Not on file  Food Insecurity: Not on file  Transportation Needs: Not on file  Physical Activity: Not on file  Stress: Not on file  Social Connections: Not on file  Intimate Partner Violence: Not on file    No current outpatient medications on file.  No Known Allergies   ROS  Constitutional: Negative for fever or weight change.  Respiratory: Negative for cough and shortness of breath.   Cardiovascular: Negative for chest pain or palpitations.  Gastrointestinal: Negative for abdominal pain, no bowel changes.  Musculoskeletal: Negative for gait problem or joint swelling.  Skin: Negative for rash.  Neurological: Negative for dizziness or headache.  No other specific complaints in a complete review of systems (except as listed in HPI above).    Objective  There were no vitals filed for this visit.  There is no height or weight on file to calculate BMI.  Physical Exam Constitutional: Patient appears well-developed and well-nourished. No distress.  HENT: Head: Normocephalic and atraumatic. Ears: B TMs ok, no erythema or  effusion; Nose: Nose normal. Mouth/Throat: Oropharynx is clear and moist. No oropharyngeal exudate.  Eyes: Conjunctivae and EOM are normal. Pupils are equal, round, and reactive to light. No scleral icterus.  Neck: Normal range of motion. Neck supple. No JVD present. No thyromegaly present.  Cardiovascular: Normal rate, regular rhythm and normal heart sounds.  No murmur heard. No BLE edema. Pulmonary/Chest: Effort normal and breath sounds normal. No respiratory distress. Abdominal: Soft. Bowel sounds are normal, no distension. There is no tenderness. no masses MALE GENITALIA: Normal descended testes bilaterally, no masses  palpated, no hernias, no lesions, no discharge RECTAL: Prostate normal size and consistency, no rectal masses or hemorrhoids Musculoskeletal: Normal range of motion, no joint effusions. No gross deformities Neurological: he is alert and oriented to person, place, and time. No cranial nerve deficit. Coordination, balance, strength, speech and gait are normal.  Skin: Skin is warm and dry. No rash noted. No erythema.  Psychiatric: Patient has a normal mood and affect. behavior is normal. Judgment and thought content normal.   No results found for this or any previous visit (from the past 2160 hour(s)).   Fall Risk:    12/01/2021    1:38 PM  Fall Risk   Falls in the past year? 0  Number falls in past yr: 0  Injury with Fall? 0   ***   Functional Status Survey:   ***   Assessment & Plan      -Prostate cancer screening and PSA options (with potential risks and benefits of testing vs not testing) were discussed along with recent recs/guidelines. -USPSTF grade A and B recommendations reviewed with patient; age-appropriate recommendations, preventive care, screening tests, etc discussed and encouraged; healthy living encouraged; see AVS for patient education given to patient -Discussed importance of 150 minutes of physical activity weekly, eat two servings of fish  weekly, eat one serving of tree nuts ( cashews, pistachios, pecans, almonds.Marland Kitchen) every other day, eat 6 servings of fruit/vegetables daily and drink plenty of water and avoid sweet beverages.  -Reviewed Health Maintenance: colorectal cancer screening, immunizations, medicare wellness visit

## 2022-06-08 ENCOUNTER — Ambulatory Visit (INDEPENDENT_AMBULATORY_CARE_PROVIDER_SITE_OTHER): Payer: Medicare Other | Admitting: Nurse Practitioner

## 2022-06-08 ENCOUNTER — Other Ambulatory Visit: Payer: Self-pay

## 2022-06-08 ENCOUNTER — Telehealth: Payer: Self-pay | Admitting: Nurse Practitioner

## 2022-06-08 ENCOUNTER — Encounter: Payer: Self-pay | Admitting: Nurse Practitioner

## 2022-06-08 VITALS — BP 128/76 | HR 87 | Temp 98.6°F | Resp 18 | Ht 66.5 in | Wt 243.7 lb

## 2022-06-08 DIAGNOSIS — E785 Hyperlipidemia, unspecified: Secondary | ICD-10-CM | POA: Diagnosis not present

## 2022-06-08 DIAGNOSIS — Z131 Encounter for screening for diabetes mellitus: Secondary | ICD-10-CM

## 2022-06-08 DIAGNOSIS — Z8679 Personal history of other diseases of the circulatory system: Secondary | ICD-10-CM | POA: Diagnosis not present

## 2022-06-08 DIAGNOSIS — Z1211 Encounter for screening for malignant neoplasm of colon: Secondary | ICD-10-CM | POA: Diagnosis not present

## 2022-06-08 DIAGNOSIS — F330 Major depressive disorder, recurrent, mild: Secondary | ICD-10-CM

## 2022-06-08 DIAGNOSIS — Z23 Encounter for immunization: Secondary | ICD-10-CM

## 2022-06-08 DIAGNOSIS — Z0000 Encounter for general adult medical examination without abnormal findings: Secondary | ICD-10-CM | POA: Diagnosis not present

## 2022-06-08 DIAGNOSIS — F17200 Nicotine dependence, unspecified, uncomplicated: Secondary | ICD-10-CM | POA: Diagnosis not present

## 2022-06-08 DIAGNOSIS — R7303 Prediabetes: Secondary | ICD-10-CM | POA: Diagnosis not present

## 2022-06-08 DIAGNOSIS — E669 Obesity, unspecified: Secondary | ICD-10-CM

## 2022-06-08 DIAGNOSIS — Z122 Encounter for screening for malignant neoplasm of respiratory organs: Secondary | ICD-10-CM

## 2022-06-08 DIAGNOSIS — Z125 Encounter for screening for malignant neoplasm of prostate: Secondary | ICD-10-CM

## 2022-06-08 NOTE — Telephone Encounter (Signed)
Will fax letter when finish

## 2022-06-08 NOTE — Telephone Encounter (Signed)
Called patient need to discuss diagnosis for the [facility]

## 2022-06-08 NOTE — Telephone Encounter (Signed)
Wife wanted to give you the correct fax number to [place] for [facility] Fax: [phone]

## 2022-06-09 LAB — CBC WITH DIFFERENTIAL/PLATELET
Absolute Monocytes: 443 cells/uL (ref 200–950)
Basophils Absolute: 47 cells/uL (ref 0–200)
Basophils Relative: 0.8 %
Eosinophils Absolute: 83 cells/uL (ref 15–500)
Eosinophils Relative: 1.4 %
HCT: 51 % — ABNORMAL HIGH (ref 38.5–50.0)
Hemoglobin: 17.6 g/dL — ABNORMAL HIGH (ref 13.2–17.1)
Lymphs Abs: 1906 cells/uL (ref 850–3900)
MCH: 29.8 pg (ref 27.0–33.0)
MCHC: 34.5 g/dL (ref 32.0–36.0)
MCV: 86.4 fL (ref 80.0–100.0)
MPV: 10.9 fL (ref 7.5–12.5)
Monocytes Relative: 7.5 %
Neutro Abs: 3422 cells/uL (ref 1500–7800)
Neutrophils Relative %: 58 %
Platelets: 209 10*3/uL (ref 140–400)
RBC: 5.9 10*6/uL — ABNORMAL HIGH (ref 4.20–5.80)
RDW: 13.6 % (ref 11.0–15.0)
Total Lymphocyte: 32.3 %
WBC: 5.9 10*3/uL (ref 3.8–10.8)

## 2022-06-09 LAB — COMPLETE METABOLIC PANEL WITH GFR
AG Ratio: 1.5 (calc) (ref 1.0–2.5)
ALT: 14 U/L (ref 9–46)
AST: 13 U/L (ref 10–35)
Albumin: 4.3 g/dL (ref 3.6–5.1)
Alkaline phosphatase (APISO): 72 U/L (ref 35–144)
BUN: 9 mg/dL (ref 7–25)
CO2: 24 mmol/L (ref 20–32)
Calcium: 9.2 mg/dL (ref 8.6–10.3)
Chloride: 106 mmol/L (ref 98–110)
Creat: 0.91 mg/dL (ref 0.70–1.30)
Globulin: 2.8 g/dL (calc) (ref 1.9–3.7)
Glucose, Bld: 90 mg/dL (ref 65–99)
Potassium: 4.1 mmol/L (ref 3.5–5.3)
Sodium: 140 mmol/L (ref 135–146)
Total Bilirubin: 0.6 mg/dL (ref 0.2–1.2)
Total Protein: 7.1 g/dL (ref 6.1–8.1)
eGFR: 101 mL/min/{1.73_m2} (ref >=60)

## 2022-06-09 LAB — HEMOGLOBIN A1C
Hgb A1c MFr Bld: 5.7 % of total Hgb — ABNORMAL HIGH (ref <5.7)
Mean Plasma Glucose: 117 mg/dL
eAG (mmol/L): 6.5 mmol/L

## 2022-06-09 LAB — LIPID PANEL
Cholesterol: 186 mg/dL (ref <200)
HDL: 46 mg/dL (ref >=40)
LDL Cholesterol (Calc): 115 mg/dL (calc) — ABNORMAL HIGH
Non-HDL Cholesterol (Calc): 140 mg/dL (calc) — ABNORMAL HIGH (ref <130)
Total CHOL/HDL Ratio: 4 (calc) (ref <5.0)
Triglycerides: 132 mg/dL (ref <150)

## 2022-06-09 LAB — PSA: PSA: 2.47 ng/mL (ref <=4.00)

## 2022-06-13 ENCOUNTER — Telehealth: Payer: Self-pay | Admitting: *Deleted

## 2022-06-13 ENCOUNTER — Other Ambulatory Visit: Payer: Self-pay | Admitting: *Deleted

## 2022-06-13 DIAGNOSIS — Z1211 Encounter for screening for malignant neoplasm of colon: Secondary | ICD-10-CM

## 2022-06-13 MED ORDER — PEG 3350-KCL-NABCB-NACL-NASULF 236 G PO SOLR: 4000.0000 mL | Freq: Once | ORAL | 0 refills | Status: AC

## 2022-06-13 NOTE — Telephone Encounter (Signed)
Gastroenterology Pre-Procedure Review  Request Date: 07/06/2022 Requesting Physician: Dr. Marius Ditch  PATIENT REVIEW QUESTIONS: The patient responded to the following health history questions as indicated:    1. Are you having any GI issues? no 2. Do you have a personal history of Polyps? no 3. Do you have a family history of Colon Cancer or Polyps? no 4. Diabetes Mellitus? no 5. Joint replacements in the past 12 months?no 6. Major health problems in the past 3 months?no 7. Any artificial heart valves, MVP, or defibrillator?no    MEDICATIONS & ALLERGIES:    Patient reports the following regarding taking any anticoagulation/antiplatelet therapy:   Plavix, Coumadin, Eliquis, Xarelto, Lovenox, Pradaxa, Brilinta, or Effient? no Aspirin? no  Patient confirms/reports the following medications:  Current Outpatient Medications  Medication Sig Dispense Refill   QUEtiapine (SEROQUEL) 50 MG tablet Take by mouth.     No current facility-administered medications for this visit.    Patient confirms/reports the following allergies:  No Known Allergies  No orders of the defined types were placed in this encounter.   AUTHORIZATION INFORMATION Primary Insurance: 1D#: Group #:  Secondary Insurance: 1D#: Group #:  SCHEDULE INFORMATION: Date: 07/06/2022 Time: Location: [place]

## 2022-06-21 DIAGNOSIS — M25362 Other instability, left knee: Secondary | ICD-10-CM | POA: Diagnosis not present

## 2022-07-06 ENCOUNTER — Ambulatory Visit: Payer: 59 | Admitting: Certified Registered"

## 2022-07-06 ENCOUNTER — Ambulatory Visit
Admission: RE | Admit: 2022-07-06 | Discharge: 2022-07-06 | Disposition: A | Discharge status: Home / Self Care | Payer: 59 | Attending: Gastroenterology | Admitting: Gastroenterology

## 2022-07-06 ENCOUNTER — Encounter
Admission: RE | Disposition: A | Discharge status: Home / Self Care | Payer: Self-pay | Source: Home / Self Care | Attending: Gastroenterology

## 2022-07-06 DIAGNOSIS — D123 Benign neoplasm of transverse colon: Secondary | ICD-10-CM | POA: Insufficient documentation

## 2022-07-06 DIAGNOSIS — D122 Benign neoplasm of ascending colon: Secondary | ICD-10-CM | POA: Diagnosis not present

## 2022-07-06 DIAGNOSIS — K635 Polyp of colon: Secondary | ICD-10-CM | POA: Diagnosis not present

## 2022-07-06 DIAGNOSIS — K644 Residual hemorrhoidal skin tags: Secondary | ICD-10-CM | POA: Diagnosis not present

## 2022-07-06 DIAGNOSIS — D124 Benign neoplasm of descending colon: Secondary | ICD-10-CM | POA: Insufficient documentation

## 2022-07-06 DIAGNOSIS — D125 Benign neoplasm of sigmoid colon: Secondary | ICD-10-CM | POA: Insufficient documentation

## 2022-07-06 DIAGNOSIS — Z1211 Encounter for screening for malignant neoplasm of colon: Secondary | ICD-10-CM

## 2022-07-06 DIAGNOSIS — D126 Benign neoplasm of colon, unspecified: Secondary | ICD-10-CM | POA: Diagnosis not present

## 2022-07-06 DIAGNOSIS — F17210 Nicotine dependence, cigarettes, uncomplicated: Secondary | ICD-10-CM | POA: Diagnosis not present

## 2022-07-06 HISTORY — PX: COLONOSCOPY WITH PROPOFOL: SHX5780

## 2022-07-06 SURGERY — COLONOSCOPY WITH PROPOFOL
Anesthesia: General

## 2022-07-06 MED ORDER — PROPOFOL 1000 MG/100ML IV EMUL
INTRAVENOUS | Status: AC
  Filled 2022-07-06: qty 100

## 2022-07-06 MED ORDER — SODIUM CHLORIDE 0.9 % IV SOLN
INTRAVENOUS | Status: DC
  Administered 2022-07-06: 20 mL/h via INTRAVENOUS

## 2022-07-06 MED ORDER — LIDOCAINE HCL (CARDIAC) PF 100 MG/5ML IV SOSY
PREFILLED_SYRINGE | INTRAVENOUS | Status: DC | PRN
  Administered 2022-07-06: 50 mg via INTRAVENOUS

## 2022-07-06 MED ORDER — PROPOFOL 500 MG/50ML IV EMUL
INTRAVENOUS | Status: DC | PRN
  Administered 2022-07-06: 150 ug/kg/min via INTRAVENOUS

## 2022-07-06 MED ORDER — PROPOFOL 10 MG/ML IV BOLUS
INTRAVENOUS | Status: DC | PRN
  Administered 2022-07-06: 70 mg via INTRAVENOUS

## 2022-07-06 NOTE — Anesthesia Postprocedure Evaluation (Signed)
Anesthesia Post Note  Patient: Howard Jennings  Procedure(s) Performed: COLONOSCOPY WITH PROPOFOL  Patient location during evaluation: PACU Anesthesia Type: General Level of consciousness: awake and oriented Pain management: satisfactory to patient Vital Signs Assessment: post-procedure vital signs reviewed and stable Respiratory status: spontaneous breathing and nonlabored ventilation Cardiovascular status: stable Anesthetic complications: no  No notable events documented.   Last Vitals:  Vitals:   07/06/22 0831 07/06/22 0932  BP: (!) 158/109 125/86  Pulse: 75 76  Resp: 20 (!) 26  Temp: (!) 36.4 C (!) 36.4 C  SpO2: 98% 99%    Last Pain:  Vitals:   07/06/22 0932  TempSrc: Temporal  PainSc: Asleep                 VAN STAVEREN,Hazelynn Mckenny

## 2022-07-06 NOTE — Transfer of Care (Signed)
Immediate Anesthesia Transfer of Care Note  Patient: Howard Jennings  Procedure(s) Performed: COLONOSCOPY WITH PROPOFOL  Patient Location: PACU and Endoscopy Unit  Anesthesia Type:General  Level of Consciousness: sedated  Airway & Oxygen Therapy: Patient Spontanous Breathing  Post-op Assessment: Report given to RN and Post -op Vital signs reviewed and stable  Post vital signs: Reviewed and stable  Last Vitals:  Vitals Value Taken Time  BP 125/86 07/06/22 0933  Temp 36.4 C 07/06/22 0932  Pulse 75 07/06/22 0932  Resp 24 07/06/22 0932  SpO2 99 % 07/06/22 0932  Vitals shown include unvalidated device data.  Last Pain:  Vitals:   07/06/22 0932  TempSrc: Temporal  PainSc: Asleep         Complications: No notable events documented.

## 2022-07-06 NOTE — Op Note (Signed)
[facility] Gastroenterology Patient Name: Howard Jennings Procedure Date: 07/06/2022 8:16 AM MRN: [id number] Account #: [id number] Date of Birth: [birth year] Admit Type: Outpatient Age: 52 Room: [facility] ENDO ROOM 4 Gender: Male Note Status: Finalized Instrument Name: Jasper Riling [id number] Procedure:             Colonoscopy Indications:           Screening for colorectal malignant neoplasm, This is                         the patient's first colonoscopy Providers:             Lin Landsman MD, MD Referring MD:          Myna Hidalgo. Reece Packer (Referring MD) Medicines:             General Anesthesia Complications:         No immediate complications. Estimated blood loss: None. Procedure:             Pre-Anesthesia Assessment:                        - Prior to the procedure, a History and Physical was                         performed, and patient medications and allergies were                         reviewed. The patient is competent. The risks and                         benefits of the procedure and the sedation options and                         risks were discussed with the patient. All questions                         were answered and informed consent was obtained.                         Patient identification and proposed procedure were                         verified by the physician, the nurse, the                         anesthesiologist, the anesthetist and the technician                         in the pre-procedure area in the procedure room in the                         endoscopy suite. Mental Status Examination: alert and                         oriented. Airway Examination: normal oropharyngeal                         airway and neck mobility. Respiratory Examination:  clear to auscultation. CV Examination: normal.                         Prophylactic Antibiotics: The patient does not require                         prophylactic  antibiotics. Prior Anticoagulants: The                         patient has taken no anticoagulant or antiplatelet                         agents. ASA Grade Assessment: II - A patient with mild                         systemic disease. After reviewing the risks and                         benefits, the patient was deemed in satisfactory                         condition to undergo the procedure. The anesthesia                         plan was to use general anesthesia. Immediately prior                         to administration of medications, the patient was                         re-assessed for adequacy to receive sedatives. The                         heart rate, respiratory rate, oxygen saturations,                         blood pressure, adequacy of pulmonary ventilation, and                         response to care were monitored throughout the                         procedure. The physical status of the patient was                         re-assessed after the procedure.                        After obtaining informed consent, the colonoscope was                         passed under direct vision. Throughout the procedure,                         the patient's blood pressure, pulse, and oxygen                         saturations were monitored continuously. The  Colonoscope was introduced through the anus and                         advanced to the the cecum, identified by appendiceal                         orifice and ileocecal valve. The colonoscopy was                         performed without difficulty. The patient tolerated                         the procedure well. The quality of the bowel                         preparation was evaluated using the BBPS [facility] Bowel                         Preparation Scale) with scores of: Right Colon = 3,                         Transverse Colon = 3 and Left Colon = 3 (entire mucosa                         seen  well with no residual staining, small fragments                         of stool or opaque liquid). The total BBPS score                         equals 9. The ileocecal valve, appendiceal orifice,                         and rectum were photographed. Findings:      The perianal and digital rectal examinations were normal. Pertinent       negatives include normal sphincter tone and no palpable rectal lesions.      11 sessile and semi-pedunculated polyps were found in the sigmoid colon       1, descending colon 5, transverse colon 2 and ascending colon 3. The       polyps were 3 to 9 mm in size. These polyps were removed with a cold       snare. Resection and retrieval were complete. Estimated blood loss: none.      Non-bleeding external hemorrhoids were found during retroflexion. The       hemorrhoids were small.      The exam was otherwise without abnormality. Impression:            - 11 3 to 9 mm polyps in the sigmoid colon, in the                         descending colon, in the transverse colon and in the                         ascending colon, removed with a cold snare. Resected  and retrieved.                        - Non-bleeding external hemorrhoids.                        - The examination was otherwise normal. Recommendation:        - Discharge patient to home (with escort).                        - Resume previous diet today.                        - Continue present medications.                        - Await pathology results.                        - Repeat colonoscopy in 1 year for surveillance of                         multiple polyps. Procedure Code(s):     --- Professional ---                        [phone], Colonoscopy, flexible; with removal of                         tumor(s), polyp(s), or other lesion(s) by snare                         technique Diagnosis Code(s):     --- Professional ---                        Z12.11, Encounter for  screening for malignant neoplasm                         of colon                        D12.5, Benign neoplasm of sigmoid colon                        D12.4, Benign neoplasm of descending colon                        D12.3, Benign neoplasm of transverse colon (hepatic                         flexure or splenic flexure)                        D12.2, Benign neoplasm of ascending colon                        K64.4, Residual hemorrhoidal skin tags CPT copyright 2022 American Medical Association. All rights reserved. The codes documented in this report are preliminary and upon coder review may  be revised to meet current compliance requirements. Dr. Ulyess Mort Lin Landsman MD, MD 07/06/2022 9:32:05 AM This report has been signed electronically. Number of Addenda: 0 Note Initiated On:  07/06/2022 8:16 AM Scope Withdrawal Time: 0 hours 28 minutes 21 seconds  Total Procedure Duration: 0 hours 29 minutes 34 seconds  Estimated Blood Loss:  Estimated blood loss: none.      [facility]

## 2022-07-06 NOTE — Anesthesia Procedure Notes (Signed)
Procedure Name: MAC Date/Time: 07/06/2022 8:54 AM  Performed by: Biagio Borg, CRNAPre-anesthesia Checklist: Patient identified, Emergency Drugs available, Suction available, Patient being monitored and Timeout performed Patient Re-evaluated:Patient Re-evaluated prior to induction Oxygen Delivery Method: Nasal cannula Induction Type: IV induction Placement Confirmation: positive ETCO2 and CO2 detector

## 2022-07-06 NOTE — Anesthesia Preprocedure Evaluation (Signed)
Anesthesia Evaluation  Patient identified by MRN, date of birth, ID band Patient awake    Reviewed: Allergy & Precautions, NPO status , Patient's Chart, lab work & pertinent test results  Airway Mallampati: II  TM Distance: >3 FB Neck ROM: full    Dental  (+) Teeth Intact   Pulmonary neg pulmonary ROS, Current Smoker and Patient abstained from smoking.   Pulmonary exam normal  + decreased breath sounds      Cardiovascular Exercise Tolerance: Good negative cardio ROS Normal cardiovascular exam Rhythm:Regular     Neuro/Psych negative neurological ROS  negative psych ROS   GI/Hepatic negative GI ROS, Neg liver ROS,,,  Endo/Other  negative endocrine ROS    Renal/GU negative Renal ROS  negative genitourinary   Musculoskeletal   Abdominal  (+) + obese  Peds negative pediatric ROS (+)  Hematology negative hematology ROS (+)   Anesthesia Other Findings No past medical history on file.  No past surgical history on file.  BMI    Body Mass Index: 39.27 kg/m      Reproductive/Obstetrics negative OB ROS                             Anesthesia Physical Anesthesia Plan  ASA: 2  Anesthesia Plan: General   Post-op Pain Management:    Induction: Intravenous  PONV Risk Score and Plan: Propofol infusion and TIVA  Airway Management Planned: Natural Airway and Nasal Cannula  Additional Equipment:   Intra-op Plan:   Post-operative Plan:   Informed Consent: I have reviewed the patients History and Physical, chart, labs and discussed the procedure including the risks, benefits and alternatives for the proposed anesthesia with the patient or authorized representative who has indicated his/her understanding and acceptance.     Dental Advisory Given  Plan Discussed with: CRNA and Surgeon  Anesthesia Plan Comments:        Anesthesia Quick Evaluation

## 2022-07-06 NOTE — H&P (Signed)
Cephas Darby, MD [address]  [place]  [place], [place] [address]  Main: [phone]  Fax: [phone] Pager: [phone]  Primary Care Physician:  Bo Merino, FNP Primary Gastroenterologist:  Dr. Cephas Darby  Pre-Procedure History & Physical: HPI:  Howard Jennings is a 52 y.o. male is here for an colonoscopy.   No past medical history on file.  No past surgical history on file.  Prior to Admission medications   Medication Sig Start Date End Date Taking? Authorizing Provider  QUEtiapine (SEROQUEL) 50 MG tablet Take by mouth. 03/07/22  Yes [provider]    Allergies as of 06/13/2022   (No Known Allergies)    Family History  Problem Relation Age of Onset   Diabetes Mother    Kidney disease Mother    Emphysema Father     Social History   Socioeconomic History   Marital status: Single    Spouse name: Not on file   Number of children: Not on file   Years of education: Not on file   Highest education level: Not on file  Occupational History   Not on file  Tobacco Use   Smoking status: Every Day    Packs/day: 0.50    Types: Cigarettes   Smokeless tobacco: Not on file  Vaping Use   Vaping Use: Never used  Substance and Sexual Activity   Alcohol use: Yes    Comment: occ   Drug use: Never   Sexual activity: Yes  Other Topics Concern   Not on file  Social History Narrative   Not on file   Social Determinants of Health   Financial Resource Strain: Low Risk  (06/08/2022)   Overall Financial Resource Strain (CARDIA)    Difficulty of Paying Living Expenses: Not hard at all  Food Insecurity: No Food Insecurity (06/08/2022)   Hunger Vital Sign    Worried About Running Out of Food in the Last Year: Never true    [place] in the Last Year: Never true  Transportation Needs: No Transportation Needs (06/08/2022)   PRAPARE - Hydrologist (Medical): No    Lack of Transportation (Non-Medical): No   Physical Activity: Insufficiently Active (06/08/2022)   Exercise Vital Sign    Days of Exercise per Week: 3 days    Minutes of Exercise per Session: 30 min  Stress: No Stress Concern Present (06/08/2022)   [place]    Feeling of Stress : Only a little  Social Connections: Moderately Isolated (06/08/2022)   Social Connection and Isolation Panel [NHANES]    Frequency of Communication with Friends and Family: More than three times a week    Frequency of Social Gatherings with Friends and Family: More than three times a week    Attends Religious Services: 1 to 4 times per year    Active Member of [employer] or Organizations: No    Attends Archivist Meetings: Never    Marital Status: Never married  Intimate Partner Violence: Not At Risk (06/08/2022)   Humiliation, Afraid, Rape, and Kick questionnaire    Fear of Current or Ex-Partner: No    Emotionally Abused: No    Physically Abused: No    Sexually Abused: No    Review of Systems: See HPI, otherwise negative ROS  Physical Exam: BP (!) 158/109   Pulse 75   Temp (!) 97.5 F (36.4 C) (Temporal)   Resp 20  Ht 5' 6.5" (1.689 m)   Wt 112 kg   SpO2 98%   BMI 39.27 kg/m  General:   Alert,  pleasant and cooperative in NAD Head:  Normocephalic and atraumatic. Neck:  Supple; no masses or thyromegaly. Lungs:  Clear throughout to auscultation.    Heart:  Regular rate and rhythm. Abdomen:  Soft, nontender and nondistended. Normal bowel sounds, without guarding, and without rebound.   Neurologic:  Alert and  oriented x4;  grossly normal neurologically.  Impression/Plan: Reatha Armour is here for an colonoscopy to be performed for colon cancer screening  Risks, benefits, limitations, and alternatives regarding  colonoscopy have been reviewed with the patient.  Questions have been answered.  All parties agreeable.   Sherri Sear, MD  07/06/2022, 8:37 AM

## 2022-07-07 ENCOUNTER — Encounter: Payer: Self-pay | Admitting: Gastroenterology

## 2022-07-07 ENCOUNTER — Telehealth: Payer: Self-pay

## 2022-07-07 DIAGNOSIS — Z86010 Personal history of colonic polyps: Secondary | ICD-10-CM

## 2022-07-07 LAB — SURGICAL PATHOLOGY

## 2022-07-07 NOTE — Telephone Encounter (Signed)
Called and left a message for call back  

## 2022-07-07 NOTE — Telephone Encounter (Signed)
Called and left a message for call back. Placed referral

## 2022-07-07 NOTE — Telephone Encounter (Signed)
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

## 2022-07-20 ENCOUNTER — Encounter: Payer: Self-pay | Admitting: Emergency Medicine

## 2022-07-20 ENCOUNTER — Other Ambulatory Visit: Payer: Self-pay

## 2022-07-20 ENCOUNTER — Emergency Department: Payer: 59

## 2022-07-20 DIAGNOSIS — Z716 Tobacco abuse counseling: Secondary | ICD-10-CM | POA: Insufficient documentation

## 2022-07-20 DIAGNOSIS — R109 Unspecified abdominal pain: Secondary | ICD-10-CM | POA: Diagnosis not present

## 2022-07-20 DIAGNOSIS — I10 Essential (primary) hypertension: Secondary | ICD-10-CM | POA: Insufficient documentation

## 2022-07-20 DIAGNOSIS — R0789 Other chest pain: Secondary | ICD-10-CM | POA: Diagnosis not present

## 2022-07-20 DIAGNOSIS — R112 Nausea with vomiting, unspecified: Secondary | ICD-10-CM | POA: Diagnosis not present

## 2022-07-20 DIAGNOSIS — R1032 Left lower quadrant pain: Secondary | ICD-10-CM | POA: Diagnosis not present

## 2022-07-20 DIAGNOSIS — I7121 Aneurysm of the ascending aorta, without rupture: Secondary | ICD-10-CM | POA: Diagnosis not present

## 2022-07-20 DIAGNOSIS — R1012 Left upper quadrant pain: Secondary | ICD-10-CM | POA: Diagnosis not present

## 2022-07-20 DIAGNOSIS — R079 Chest pain, unspecified: Secondary | ICD-10-CM | POA: Diagnosis not present

## 2022-07-20 LAB — COMPREHENSIVE METABOLIC PANEL
ALT: 23 U/L (ref 0–44)
AST: 25 U/L (ref 15–41)
Albumin: 4.3 g/dL (ref 3.5–5.0)
Alkaline Phosphatase: 68 U/L (ref 38–126)
Anion gap: 9 (ref 5–15)
BUN: 10 mg/dL (ref 6–20)
CO2: 22 mmol/L (ref 22–32)
Calcium: 9.1 mg/dL (ref 8.9–10.3)
Chloride: 108 mmol/L (ref 98–111)
Creatinine, Ser: 0.76 mg/dL (ref 0.61–1.24)
GFR, Estimated: >60 mL/min (ref >60)
Glucose, Bld: 98 mg/dL (ref 70–99)
Potassium: 3.7 mmol/L (ref 3.5–5.1)
Sodium: 139 mmol/L (ref 135–145)
Total Bilirubin: 1 mg/dL (ref 0.3–1.2)
Total Protein: 7.7 g/dL (ref 6.5–8.1)

## 2022-07-20 LAB — CBC
HCT: 50.3 % (ref 39.0–52.0)
Hemoglobin: 17.1 g/dL — ABNORMAL HIGH (ref 13.0–17.0)
MCH: 28.6 pg (ref 26.0–34.0)
MCHC: 34 g/dL (ref 30.0–36.0)
MCV: 84.1 fL (ref 80.0–100.0)
Platelets: 272 10*3/uL (ref 150–400)
RBC: 5.98 MIL/uL — ABNORMAL HIGH (ref 4.22–5.81)
RDW: 14 % (ref 11.5–15.5)
WBC: 9.8 10*3/uL (ref 4.0–10.5)
nRBC: 0 % (ref 0.0–0.2)

## 2022-07-20 LAB — URINALYSIS, ROUTINE W REFLEX MICROSCOPIC
Bilirubin Urine: NEGATIVE
Glucose, UA: NEGATIVE mg/dL
Hgb urine dipstick: NEGATIVE
Ketones, ur: NEGATIVE mg/dL
Leukocytes,Ua: NEGATIVE
Nitrite: NEGATIVE
Protein, ur: NEGATIVE mg/dL
Specific Gravity, Urine: 1.021 (ref 1.005–1.030)
pH: 5 (ref 5.0–8.0)

## 2022-07-20 LAB — LIPASE, BLOOD: Lipase: 51 U/L (ref 11–51)

## 2022-07-20 LAB — TROPONIN I (HIGH SENSITIVITY): Troponin I (High Sensitivity): 20 ng/L — ABNORMAL HIGH (ref <18)

## 2022-07-20 NOTE — ED Triage Notes (Signed)
  Patient comes in with LUQ pain and L flank pain that has been going on for 2 days.  Patient states he is having trouble sleeping and laying on L side.  Patient endorses nausea with no vomiting.  Endorses bloated feeling.  No diaphoresis, jaw/shoulder pain.  Pain 10/10, stabbing/sharp LUQ.

## 2022-07-21 ENCOUNTER — Emergency Department
Admission: EM | Admit: 2022-07-21 | Discharge: 2022-07-21 | Disposition: A | Discharge status: Home / Self Care | Payer: 59 | Attending: Emergency Medicine | Admitting: Emergency Medicine

## 2022-07-21 ENCOUNTER — Emergency Department: Payer: 59

## 2022-07-21 DIAGNOSIS — I7121 Aneurysm of the ascending aorta, without rupture: Secondary | ICD-10-CM

## 2022-07-21 DIAGNOSIS — R112 Nausea with vomiting, unspecified: Secondary | ICD-10-CM | POA: Diagnosis not present

## 2022-07-21 DIAGNOSIS — I10 Essential (primary) hypertension: Secondary | ICD-10-CM

## 2022-07-21 DIAGNOSIS — Z716 Tobacco abuse counseling: Secondary | ICD-10-CM

## 2022-07-21 DIAGNOSIS — R1012 Left upper quadrant pain: Secondary | ICD-10-CM | POA: Diagnosis not present

## 2022-07-21 DIAGNOSIS — R0789 Other chest pain: Secondary | ICD-10-CM

## 2022-07-21 DIAGNOSIS — R1032 Left lower quadrant pain: Secondary | ICD-10-CM

## 2022-07-21 HISTORY — DX: Aneurysm of the ascending aorta, without rupture: I71.21

## 2022-07-21 LAB — TROPONIN I (HIGH SENSITIVITY): Troponin I (High Sensitivity): 19 ng/L — ABNORMAL HIGH (ref <18)

## 2022-07-21 MED ORDER — KETOROLAC TROMETHAMINE 30 MG/ML IJ SOLN
30.0000 mg | Freq: Once | INTRAMUSCULAR | Status: AC
  Administered 2022-07-21: 30 mg via INTRAVENOUS
  Filled 2022-07-21: qty 1

## 2022-07-21 MED ORDER — IOHEXOL 350 MG/ML SOLN
100.0000 mL | Freq: Once | INTRAVENOUS | Status: AC | PRN
  Administered 2022-07-21: 100 mL via INTRAVENOUS

## 2022-07-21 MED ORDER — NICOTINE 7 MG/24HR TD PT24: 7.0000 mg | MEDICATED_PATCH | TRANSDERMAL | 3 refills | Status: DC

## 2022-07-21 MED ORDER — NICOTINE POLACRILEX 4 MG MT LOZG: 4.0000 mg | LOZENGE | OROMUCOSAL | 0 refills | Status: DC | PRN

## 2022-07-21 MED ORDER — HYDROCHLOROTHIAZIDE 12.5 MG PO TABS: 12.5000 mg | ORAL_TABLET | Freq: Every day | ORAL | 2 refills | Status: DC

## 2022-07-21 NOTE — ED Provider Notes (Addendum)
[facility] Provider Note    Event Date/Time   First MD Initiated Contact with Patient 07/21/22 0119     (approximate)   History   Abdominal Pain and Chest Pain   HPI  Howard Jennings is a 52 y.o. male   Past medical history of hypertension, tobacco use, hyperlipidemia who presents to the emergency department with 2 days of left-sided chest pain and domino pain.  No obvious inciting event.  Worsens with deep breaths, twisting motions.  No nausea vomiting diarrhea.  No GU symptoms.  No fever or cough.  No shortness of breath.  He also has chronic left-sided knee pain seen by [facility] and prescribed NSAIDs.   External Medical Documents Reviewed: Emergency department visit dated October 2023 for left foot wound      Physical Exam   Triage Vital Signs: ED Triage Vitals [07/20/22 2223]  Enc Vitals Group     BP (!) 180/118     Pulse Rate 83     Resp 18     Temp 98.1 F (36.7 C)     Temp Source Oral     SpO2 97 %     Weight 243 lb (110.2 kg)     Height 5' 6"$  (1.676 m)     Head Circumference      Peak Flow      Pain Score 10     Pain Loc      Pain Edu?      Excl. in [place]?     Most recent vital signs: Vitals:   07/20/22 2223 07/21/22 0130  BP: (!) 180/118 (!) 171/115  Pulse: 83 76  Resp: 18 18  Temp: 98.1 F (36.7 C)   SpO2: 97% 99%    General: Awake, no distress.  CV:  Good peripheral perfusion.  Resp:  Normal effort.  Abd:  No distention.  Other:  Pretense of 180/100, left-sided chest wall tenderness to palpation, lungs clear, abdomen soft but tender in the left side greater than the right, radial pulses intact bilaterally.  Comfortable otherwise breathing normally, no hypoxemia   ED Results / Procedures / Treatments   Labs (all labs ordered are listed, but only abnormal results are displayed) Labs Reviewed  CBC - Abnormal; Notable for the following components:      Result Value   RBC 5.98 (*)    Hemoglobin 17.1 (*)     All other components within normal limits  URINALYSIS, ROUTINE W REFLEX MICROSCOPIC - Abnormal; Notable for the following components:   Color, Urine YELLOW (*)    APPearance CLEAR (*)    All other components within normal limits  TROPONIN I (HIGH SENSITIVITY) - Abnormal; Notable for the following components:   Troponin I (High Sensitivity) 20 (*)    All other components within normal limits  TROPONIN I (HIGH SENSITIVITY) - Abnormal; Notable for the following components:   Troponin I (High Sensitivity) 19 (*)    All other components within normal limits  COMPREHENSIVE METABOLIC PANEL  LIPASE, BLOOD     I ordered and reviewed the above labs they are notable for chronically elevated hemoglobin 17.1, initial troponin is 20  EKG  ED ECG REPORT I, Lucillie Garfinkel, the attending physician, personally viewed and interpreted this ECG.   Date: 07/21/2022  EKG Time: 2225  Rate: 80  Rhythm: nsr  Axis: nl  Intervals:none  ST&T Change: No acute ischemic changes    RADIOLOGY I independently reviewed and interpreted chest x-ray I see  no obvious focal consolidations or pneumothorax   PROCEDURES:  Critical Care performed: No  Procedures   MEDICATIONS ORDERED IN ED: Medications  ketorolac (TORADOL) 30 MG/ML injection 30 mg (30 mg Intravenous Given 07/21/22 0152)  iohexol (OMNIPAQUE) 350 MG/ML injection 100 mL (100 mLs Intravenous Contrast Given 07/21/22 0158)    IMPRESSION / MDM / ASSESSMENT AND PLAN / ED COURSE  I reviewed the triage vital signs and the nursing notes.                                Patient's presentation is most consistent with acute presentation with potential threat to life or bodily function.  Differential diagnosis includes, but is not limited to, ACS, PE, dissection, costochondritis/musculoskeletal, rib fracture, pneumothorax, intra-abdominal infection, obstruction, renal colic/nephrolithiasis   The patient is on the cardiac monitor to evaluate for evidence  of arrhythmia and/or significant heart rate changes.  MDM: Patient with chest and abdominal pain with cardiac risk factors.  Tender abdomen.  Check ACS with EKG and troponins.  Check PE/dissection with a CT angiogram of the chest.  Went down with IV contrast scan of the abdomen and pelvis to assess for intra-abdominal infection or other surgical abdominal pathologies.  IV Toradol for pain control.  --- I spent 5 minutes counseling this patient on smoking cessation.  We spoke about the patient's current tobacco use, impact of smoking, assessed willingness to quit, methods for cessation including medical management and nicotine replacement therapy (which I prescribed to the patient) and advised follow-up with primary doctor to continue to address smoking cessation. ---   Patient comfortable and sleeping, troponins have been flat, CT scans negative for acute emergent pathologies.   I considered hospitalization for admission or observation but since the patient is comfortable, plan for outpatient follow-up with new blood pressure medications for hypertension, smoking cessation therapy nicotine replacement, and informed of enlarged aorta to be followed up by PMD --thus, I think outpatient monitoring and follow-up most appropriate at this time.        FINAL CLINICAL IMPRESSION(S) / ED DIAGNOSES   Final diagnoses:  Atypical chest pain  Left lower quadrant abdominal pain  Encounter for smoking cessation counseling  Aneurysm of ascending aorta without rupture ([place])  Uncontrolled hypertension     Rx / DC Orders   ED Discharge Orders          Ordered    nicotine (NICODERM CQ - DOSED IN MG/24 HR) 7 mg/24hr patch  Every 24 hours        07/21/22 0204    nicotine polacrilex (NICOTINE MINI) 4 MG lozenge  As needed        07/21/22 0204    hydrochlorothiazide (HYDRODIURIL) 12.5 MG tablet  Daily        07/21/22 [id number]             Note:  This document was prepared using Dragon voice  recognition software and may include unintentional dictation errors.    Lucillie Garfinkel, MD 07/21/22 [id number]    Lucillie Garfinkel, MD 07/21/22 [phone]

## 2022-07-21 NOTE — ED Notes (Signed)
Pt Dc to home. Dc instructions reviewed with all questions answered. Pt voices understanding. IV removed, cath intact, pressure dressing applied. No bleeding noted at site. PT ambulatory out of dept with steady gait

## 2022-07-21 NOTE — Discharge Instructions (Addendum)
Call your doctor for follow-up to recheck your aorta which is enlarged.  Call your doctor also discuss high blood pressure.  Take blood pressure medication as prescribed.  Take acetaminophen 650 mg and ibuprofen 400 mg every 6 hours for pain.  Take with food. Thank you for choosing [place] for your health care today!  Please see your primary doctor this week for a follow up appointment.   Sometimes, in the early stages of certain disease courses it is difficult to detect in the emergency department evaluation -- so, it is important that you continue to monitor your symptoms and call your doctor right away or return to the emergency department if you develop any new or worsening symptoms.  Please go to the following website to schedule new (and existing) patient appointments:   [email or web address]  If you do not have a primary doctor try calling the following clinics to establish care:  If you have insurance:  [facility] [phone] [place] [address]   [facility]  [phone] [place] [place] [address]   If you do not have insurance:  [facility]  [phone] [address], [place] [address]   The following is another list of primary care offices in the area who are accepting new patients at this time.  Please reach out to one of them directly and let them know you would like to schedule an appointment to follow up on an Emergency Department visit, and/or to establish a new primary care provider (PCP).  There are likely other primary care clinics in the are who are accepting new patients, but this is an excellent place to start:  [place] physician: Dr Lavon Paganini [address] [place], [place] [address] [phone]  [facility] Lead Physician: Dr Steele Sizer [address], [place], [place] [address] [phone]  [place] Physician: Dr Park Liter [address] [place], [place] [address] [phone]  [facility] Lead Physician: Dr Dewaine Oats [place], [place], [place] [address] [phone]  [facility] Physician: Dr Halina Maidens [address] [place],  [address] [phone]   It was my pleasure to care for you today.   Hoover Brunette Jacelyn Grip, MD

## 2022-07-21 NOTE — ED Notes (Signed)
Pt to CT via stretcher at this time

## 2022-08-05 ENCOUNTER — Ambulatory Visit: Payer: Self-pay

## 2022-08-05 ENCOUNTER — Encounter: Payer: Self-pay | Admitting: *Deleted

## 2022-08-05 DIAGNOSIS — R0789 Other chest pain: Secondary | ICD-10-CM | POA: Diagnosis not present

## 2022-08-05 DIAGNOSIS — R9431 Abnormal electrocardiogram [ECG] [EKG]: Secondary | ICD-10-CM | POA: Diagnosis not present

## 2022-08-05 DIAGNOSIS — I10 Essential (primary) hypertension: Secondary | ICD-10-CM | POA: Diagnosis not present

## 2022-08-05 DIAGNOSIS — F17210 Nicotine dependence, cigarettes, uncomplicated: Secondary | ICD-10-CM | POA: Diagnosis not present

## 2022-08-05 DIAGNOSIS — R079 Chest pain, unspecified: Secondary | ICD-10-CM | POA: Diagnosis not present

## 2022-08-05 NOTE — Telephone Encounter (Signed)
  Chief Complaint: chest pain Symptoms: chest pain L sided into neck and L shoulder  Frequency: 1 week  Pertinent Negatives: Patient denies dizziness or HA Disposition: [] ED /[] Urgent Care (no appt availability in office) / [x] Appointment(In office/virtual)/ []  [facility]/ [] Home Care/ [] Refused Recommended Disposition /[] [facility]/ []  Follow-up with PCP Additional Notes: pt went to ED on 07/21/22 and and last night on 08/04/22 for chest pain. Pt is upset that they did labs and EKG and no one told him what was going on. I scheduled [facility] on 08/09/22 at 1520. Advised pt if pain gets worse to return to ED for further eval. Pt states the pain is so bad at night time he is unable to sleep so asking if something can be called in for pain to take at night. Advised him I will send message to provider for review but she may want to wait until appt before prescribing. Pt would like FU call today if possible.   Reason for Disposition  [1] Chest pain lasts > 5 minutes AND [2] occurred > 3 days ago (72 hours) AND [3] NO chest pain or cardiac symptoms now  Answer Assessment - Initial Assessment Questions 1. LOCATION: "Where does it hurt?"       L chest  2. RADIATION: "Does the pain go anywhere else?" (e.g., into neck, jaw, arms, back)     Into neck and L shoulder 3. ONSET: "When did the chest pain begin?" (Minutes, hours or days)      1 week 4. PATTERN: "Does the pain come and go, or has it been constant since it started?"  "Does it get worse with exertion?"      Comes and goes  5. DURATION: "How long does it last" (e.g., seconds, minutes, hours)     5-6 mins  6. SEVERITY: "How bad is the pain?"  (e.g., Scale 1-10; mild, moderate, or severe)    - MILD (1-3): doesn't interfere with normal activities     - MODERATE (4-7): interferes with normal activities or awakens from sleep    - SEVERE (8-10): excruciating pain, unable to do any normal activities       10  7. CARDIAC RISK  FACTORS: "Do you have any history of heart problems or risk factors for heart disease?" (e.g., angina, prior heart attack; diabetes, high blood pressure, high cholesterol, smoker, or strong family history of heart disease)     HTN 10. OTHER SYMPTOMS: "Do you have any other symptoms?" (e.g., dizziness, nausea, vomiting, sweating, fever, difficulty breathing, cough)       Coughing, can sit down and pain is present but when standing it will ease pain  Protocols used: Chest Pain-A-AH

## 2022-08-05 NOTE — Telephone Encounter (Signed)
This encounter was created in error - please disregard.

## 2022-08-05 NOTE — Telephone Encounter (Signed)
Patient returned call regarding pain in left arm and shoulder. Reports he received a medication for arthritis that has not helped with pain and made him sick on stomach . In review of chart no medication noted that was given. C/o sharp pains left arm. Reviewed with patient if pain worsens go to ED. Also provided patient with information regarding mobile van tomorrow in [place] if pain not severe. Please advise appt already scheduled for 08/09/22. Please advise .

## 2022-08-08 NOTE — Telephone Encounter (Signed)
FYI Patient seen in ER 3/8

## 2022-08-09 ENCOUNTER — Other Ambulatory Visit: Payer: Self-pay

## 2022-08-09 ENCOUNTER — Ambulatory Visit (INDEPENDENT_AMBULATORY_CARE_PROVIDER_SITE_OTHER): Payer: 59 | Admitting: Physician Assistant

## 2022-08-09 ENCOUNTER — Encounter: Payer: Self-pay | Admitting: Physician Assistant

## 2022-08-09 VITALS — BP 132/74 | HR 78 | Temp 98.1°F | Resp 16 | Ht 66.5 in | Wt 242.0 lb

## 2022-08-09 DIAGNOSIS — M25512 Pain in left shoulder: Secondary | ICD-10-CM

## 2022-08-09 MED ORDER — METHOCARBAMOL 750 MG PO TABS: 750.0000 mg | ORAL_TABLET | Freq: Three times a day (TID) | ORAL | 0 refills | Status: DC | PRN

## 2022-08-09 MED ORDER — DICLOFENAC SODIUM 75 MG PO TBEC: 75.0000 mg | DELAYED_RELEASE_TABLET | Freq: Two times a day (BID) | ORAL | 0 refills | Status: DC

## 2022-08-09 NOTE — Patient Instructions (Addendum)
I recommend the following for your shoulder pain at this time   Rest Warm compresses to the area (20 minutes on, minimum of 30 minutes off) You can alternate Tylenol and Ibuprofen for pain management but Ibuprofen is typically preferred to reduce inflammation.  Instead of Ibuprofen, I have sent in a script for Diclofenac to be taken by mouth twice per day, with food and plenty of water.  You can take the Robaxin as needed but it can cause drowsiness so do not take it when you need to be alert.  You can use topical ointments such as IcyHot, Voltaren gel and Lidocaine patches for further relief as needed  Gentle stretches and exercises that I have included in your paperwork Try to reduce excess strain to the area and rest as much as possible  Wear supportive shoes and, if you must lift anything, use proper lifting techniques that spare your back.   If these measures do not lead to improvement in your symptoms over the next 2-4 weeks please let us know

## 2022-08-09 NOTE — Progress Notes (Signed)
Acute Office Visit   Patient: Howard Jennings   DOB: 14-Oct-1970   52 y.o. Male  MRN: [id number] Visit Date: 08/09/2022  Today's healthcare provider: Dani Gobble Aamiyah Derrick, PA-C  Introduced myself to the patient as a Journalist, newspaper and provided education on APPs in clinical practice.    Chief Complaint  Patient presents with   Chest Pain    Seen in ER. Still having pain in shoulder that radiates down left arm for 2 weeks.   Subjective    HPI HPI     Chest Pain    Additional comments: Seen in ER. Still having pain in shoulder that radiates down left arm for 2 weeks.      Last edited by Hollie Salk, [place] on 08/09/2022  3:15 PM.         Left shoulder pain  He is right handed  Onset:sudden  Duration: about 2 weeks  Location: in left shoulder - patient was not able to elaborate if pain is anterior, posterior, or along deltoid Pain level and character: 10/10 sharp, constant Radiation: reports it radiates into left chest and back as well as into axilla and left ribs  Interventions: he states he was given Celebrex at ED on 07/21/22 but states this upsets his stomach. He has not tried other OTC pain relievers or home measures  Alleviating: sometimes moving it around seems to help Aggravating: Lifting arm hurts   He denies injuries to the shoulder or trauma  He reports he was a patient at [facility] for this shoulder - reports he was getting steroid injections but is not sure what his dx was  He states as a patient at [facility] ortho he did have some improvement in pain but it has now returned and is worse in severity  He states in the interim years the pain had apparently resolved but is back now   Patient was evaluated for chest pain at [facility] ED on 08/05/22  Reviewed EKG, CXR, labs and ED visit notes - all reassuring  He was given Lidocaine patch, Toradol injection and aspirin at ED and was discharged home same day   Medications: Outpatient Medications Prior to Visit   Medication Sig   hydrochlorothiazide (HYDRODIURIL) 12.5 MG tablet Take 1 tablet (12.5 mg total) by mouth daily.   nicotine (NICODERM CQ - DOSED IN MG/24 HR) 7 mg/24hr patch Place 1 patch (7 mg total) onto the skin daily.   nicotine polacrilex (NICOTINE MINI) 4 MG lozenge Take 1 lozenge (4 mg total) by mouth as needed for smoking cessation.   QUEtiapine (SEROQUEL) 50 MG tablet Take by mouth.   [DISCONTINUED] celecoxib (CELEBREX) 200 MG capsule Take 200 mg by mouth daily.   No facility-administered medications prior to visit.    Review of Systems  Musculoskeletal:  Positive for arthralgias and myalgias.  Neurological:  Positive for numbness (reports numbness and tingling along entire left arm and hand).       Objective    BP 132/74   Pulse 78   Temp 98.1 F (36.7 C) (Oral)   Resp 16   Ht 5' 6.5" (1.689 m)   Wt 242 lb (109.8 kg)   SpO2 98%   BMI 38.47 kg/m    Physical Exam Vitals reviewed.  Constitutional:      General: He is awake. He is not in acute distress.    Appearance: Normal appearance. He is well-developed and well-groomed. He is not ill-appearing or toxic-appearing.  Comments: Patient is sitting in chair, appears comfortable and in no acute distress- able to swing left shoulder around in small circles while discussing pain    HENT:     Head: Normocephalic and atraumatic.  Cardiovascular:     Pulses:          Radial pulses are 2+ on the right side and 2+ on the left side.  Musculoskeletal:     Right shoulder: Normal.     Left shoulder: No swelling, deformity, effusion, tenderness, bony tenderness or crepitus. Decreased range of motion. Normal strength.     Right upper arm: Normal.     Left upper arm: Normal.     Right hand: Normal range of motion. Normal pulse.     Left hand: Normal range of motion. Normal pulse.     Comments: Decreased ROM with forward flexion and extension on left shoulder compared to right. Left shoulder able to get to approx 160  degrees with forward flexion and about 20 with extension Internal and external rotations are intact and symmetrical to right shoulder Abduction is mildly decreased on left compared to right shoulder  Positive empty can test on left  Negative drop arm test on left   Neurological:     General: No focal deficit present.     Mental Status: He is alert and oriented to person, place, and time.     GCS: GCS eye subscore is 4. GCS verbal subscore is 5. GCS motor subscore is 6.     Cranial Nerves: No dysarthria or facial asymmetry.     Motor: Motor function is intact. No weakness, tremor, atrophy or abnormal muscle tone.     Gait: Gait is intact.  Psychiatric:        Behavior: Behavior is cooperative.       No results found for any visits on 08/09/22.  Assessment & Plan      No follow-ups on file.      Problem List Items Addressed This Visit   None Visit Diagnoses     Acute pain of left shoulder    -  Primary Acute, recurrent concern Reports similar pain in left shoulder years ago but is not sure of etiology Patient responses to HPI questions were not very illuminating in regards to narrowing down location of pain or provocations. Cannot rule out muscular injury, rotator cuff injury, arthritis, strain/sprain at this time Will treat with conservative measures - NSAIDs= Diclofenac, Tylenol, Robaxin  Recommend warm compresses, lidocaine patches PRN, massage and gentle stretches- provided in AVS for home therapy.  Will place referral to [facility] per request  Will place PT referral as well for evaluation and further management Follow up as needed and continue collaboration with specialties as needed     Relevant Medications   methocarbamol (ROBAXIN-750) 750 MG tablet   diclofenac (VOLTAREN) 75 MG EC tablet   Other Relevant Orders   Ambulatory referral to Orthopedics   Ambulatory referral to Physical Therapy        No follow-ups on file.   I, Jaymes Revels E Logun Colavito, PA-C, have  reviewed all documentation for this visit. The documentation on 08/12/22 for the exam, diagnosis, procedures, and orders are all accurate and complete.   Talitha Givens, MHS, PA-C [facility]

## 2022-08-11 ENCOUNTER — Ambulatory Visit: Payer: 59

## 2022-11-03 ENCOUNTER — Encounter: Payer: Self-pay | Admitting: *Deleted

## 2022-11-03 ENCOUNTER — Emergency Department: Payer: Medicare HMO

## 2022-11-03 ENCOUNTER — Other Ambulatory Visit: Payer: Self-pay

## 2022-11-03 DIAGNOSIS — M545 Low back pain, unspecified: Secondary | ICD-10-CM | POA: Diagnosis not present

## 2022-11-03 DIAGNOSIS — M549 Dorsalgia, unspecified: Secondary | ICD-10-CM | POA: Insufficient documentation

## 2022-11-03 DIAGNOSIS — M542 Cervicalgia: Secondary | ICD-10-CM | POA: Insufficient documentation

## 2022-11-03 DIAGNOSIS — Z5321 Procedure and treatment not carried out due to patient leaving prior to being seen by health care provider: Secondary | ICD-10-CM | POA: Diagnosis not present

## 2022-11-03 DIAGNOSIS — M47812 Spondylosis without myelopathy or radiculopathy, cervical region: Secondary | ICD-10-CM | POA: Diagnosis not present

## 2022-11-03 DIAGNOSIS — Y92410 Unspecified street and highway as the place of occurrence of the external cause: Secondary | ICD-10-CM | POA: Insufficient documentation
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
## 2022-11-04 ENCOUNTER — Emergency Department
Admission: EM | Admit: 2022-11-04 | Discharge: 2022-11-04 | Discharge status: Against Medical Advice | Payer: Medicare HMO | Attending: Emergency Medicine | Admitting: Emergency Medicine

## 2022-11-04 NOTE — ED Notes (Signed)
No answer when called several times from lobby 

## 2022-12-07 NOTE — Progress Notes (Deleted)
   There were no vitals taken for this visit.   Subjective:    Patient ID: ALEXIE SAMSON, male    DOB: [birth year], 52 y.o.   MRN: [id number]  HPI: TAVIEN CHESTNUT is a 52 y.o. male  No chief complaint on file.   Tobacco dependence:  He says he smokes about 1/2 pack a day. He says that he has been a smoker for a long time.  He denies any shortness of breath.  Patient has been working on smoking cessation.  ***  Hypertension:  -Medications: hydrochlorothiazide 12.5 mg daily -Patient is compliant with above medications and reports no side effects. -Checking BP at home (average): *** -Highest BP at home: *** -Lowest BP at home: *** -Denies any SOB, CP, vision changes, LE edema or symptoms of hypotension -Diet: recommend DASH diet -Exercise: increase physical activity as tolerated   Obesity:  Current weight : *** BMI: *** previous weight:237 lbs Treatment Tried: *** Comorbidities: HTN      08/09/2022    3:17 PM 06/08/2022    9:22 AM 12/01/2021    1:38 PM  Depression screen PHQ 2/9  Decreased Interest 1 1 0  Down, Depressed, Hopeless 1 1 0  PHQ - 2 Score 2 2 0  Altered sleeping 1 1 0  Tired, decreased energy 1 1 0  Change in appetite 1 1 0  Feeling bad or failure about yourself  1 1 0  Trouble concentrating 1 1 0  Moving slowly or fidgety/restless 1 1 0  Suicidal thoughts 0 0 0  PHQ-9 Score 8 8 0  Difficult doing work/chores  Not difficult at all Not difficult at all     Relevant past medical, surgical, family and social history reviewed and updated as indicated. Interim medical history since our last visit reviewed. Allergies and medications reviewed and updated.  Review of Systems  Constitutional: Negative for fever or weight change.  Respiratory: Negative for cough and shortness of breath.   Cardiovascular: Negative for chest pain or palpitations.  Gastrointestinal: Negative for abdominal pain, no bowel changes.  Musculoskeletal: Negative for gait problem  or joint swelling.  Skin: Negative for rash.  Neurological: Negative for dizziness or headache.  No other specific complaints in a complete review of systems (except as listed in HPI above).      Objective:    There were no vitals taken for this visit.  Wt Readings from Last 3 Encounters:  11/03/22 240 lb 4.8 oz (109 kg)  08/09/22 242 lb (109.8 kg)  07/20/22 243 lb (110.2 kg)    Physical Exam  Constitutional: Patient appears well-developed and well-nourished. Obese  No distress.  HEENT: head atraumatic, normocephalic, pupils equal and reactive to light,  neck supple Cardiovascular: Normal rate, regular rhythm and normal heart sounds.  No murmur heard. No BLE edema. Pulmonary/Chest: Effort normal and breath sounds normal. No respiratory distress. Abdominal: Soft.  There is no tenderness. Psychiatric: Patient has a normal mood and affect. behavior is normal. Judgment and thought content normal.     Assessment & Plan:   Problem List Items Addressed This Visit   None    Follow up plan: No follow-ups on file.

## 2022-12-08 ENCOUNTER — Ambulatory Visit: Payer: Medicare HMO | Admitting: Nurse Practitioner

## 2023-03-15 ENCOUNTER — Ambulatory Visit: Payer: Self-pay

## 2023-03-15 ENCOUNTER — Ambulatory Visit: Payer: Medicare HMO | Admitting: Family Medicine

## 2023-03-15 ENCOUNTER — Encounter: Payer: Self-pay | Admitting: Family Medicine

## 2023-03-15 VITALS — BP 124/76 | HR 96 | Temp 98.2°F | Resp 16 | Ht 66.0 in | Wt 208.0 lb

## 2023-03-15 DIAGNOSIS — M25512 Pain in left shoulder: Secondary | ICD-10-CM | POA: Diagnosis not present

## 2023-03-15 DIAGNOSIS — J40 Bronchitis, not specified as acute or chronic: Secondary | ICD-10-CM

## 2023-03-15 DIAGNOSIS — R062 Wheezing: Secondary | ICD-10-CM | POA: Diagnosis not present

## 2023-03-15 DIAGNOSIS — G8929 Other chronic pain: Secondary | ICD-10-CM

## 2023-03-15 DIAGNOSIS — K219 Gastro-esophageal reflux disease without esophagitis: Secondary | ICD-10-CM | POA: Diagnosis not present

## 2023-03-15 MED ORDER — MELOXICAM 15 MG PO TABS: 15.0000 mg | ORAL_TABLET | Freq: Every day | ORAL | 0 refills | Status: DC

## 2023-03-15 MED ORDER — PANTOPRAZOLE SODIUM 20 MG PO TBEC: 20.0000 mg | DELAYED_RELEASE_TABLET | Freq: Every day | ORAL | 1 refills | Status: DC | PRN

## 2023-03-15 MED ORDER — ALBUTEROL SULFATE HFA 108 (90 BASE) MCG/ACT IN AERS: 2.0000 | INHALATION_SPRAY | RESPIRATORY_TRACT | 0 refills | Status: DC | PRN

## 2023-03-15 MED ORDER — PREDNISONE 20 MG PO TABS: 40.0000 mg | ORAL_TABLET | Freq: Every day | ORAL | 0 refills | Status: AC

## 2023-03-15 MED ORDER — DM-GUAIFENESIN ER 30-600 MG PO TB12: 1.0000 | ORAL_TABLET | Freq: Two times a day (BID) | ORAL | 0 refills | Status: DC

## 2023-03-15 NOTE — Progress Notes (Signed)
Patient ID: Howard Jennings, male    DOB: 1970/09/30, 52 y.o.   MRN: [id number]  PCP: Berniece Salines, FNP  Chief Complaint  Patient presents with   Cough    X1 month, productive   Wheezing    Subjective:   Howard Jennings is a 52 y.o. male, presents to clinic with CC of the following:  HPI  Pt presents for cough and wheeze - audible wheeze and ratteling esp at night Symptoms have been ongoing for more than 2 months He is a current smoker, denies any past COPD or asthma He denies exertional shortness of breath, pain with taking a deep breath, fever sweats chills, orthopnea, PND, lower extremity edema, weight gain  Patient Active Problem List   Diagnosis Date Noted   Screening for colon cancer 07/06/2022   Polyp of colon 07/06/2022   Hyperlipidemia 06/08/2022   Mild episode of recurrent major depressive disorder (HCC) 06/08/2022   History of hypertension 12/01/2021   Tobacco dependence 12/01/2021   Obesity (BMI 30-39.9) 12/01/2021      Current Outpatient Medications:    diclofenac (VOLTAREN) 75 MG EC tablet, Take 1 tablet (75 mg total) by mouth 2 (two) times daily., Disp: 30 tablet, Rfl: 0   hydrochlorothiazide (HYDRODIURIL) 12.5 MG tablet, Take 1 tablet (12.5 mg total) by mouth daily., Disp: 30 tablet, Rfl: 2   methocarbamol (ROBAXIN-750) 750 MG tablet, Take 1 tablet (750 mg total) by mouth every 8 (eight) hours as needed for muscle spasms., Disp: 30 tablet, Rfl: 0   nicotine (NICODERM CQ - DOSED IN MG/24 HR) 7 mg/24hr patch, Place 1 patch (7 mg total) onto the skin daily., Disp: 90 patch, Rfl: 3   nicotine polacrilex (NICOTINE MINI) 4 MG lozenge, Take 1 lozenge (4 mg total) by mouth as needed for smoking cessation., Disp: 100 tablet, Rfl: 0   QUEtiapine (SEROQUEL) 50 MG tablet, Take by mouth., Disp: , Rfl:    No Known Allergies   Social History   Tobacco Use   Smoking status: Every Day    Current packs/day: 0.50    Types: Cigarettes  Vaping Use    Vaping status: Never Used  Substance Use Topics   Alcohol use: Not Currently    Comment: occ   Drug use: Never      Chart Review Today: I personally reviewed active problem list, medication list, allergies, family history, social history, health maintenance, notes from last encounter, lab results, imaging with the patient/caregiver today.   Review of Systems  Constitutional: Negative.   HENT: Negative.    Eyes: Negative.   Respiratory: Negative.    Cardiovascular: Negative.   Gastrointestinal: Negative.   Endocrine: Negative.   Genitourinary: Negative.   Musculoskeletal: Negative.   Skin: Negative.   Allergic/Immunologic: Negative.   Neurological: Negative.   Hematological: Negative.   Psychiatric/Behavioral: Negative.    All other systems reviewed and are negative.      Objective:   There were no vitals filed for this visit.  There is no height or weight on file to calculate BMI.  Physical Exam Vitals and nursing note reviewed.  Constitutional:      General: He is not in acute distress.    Appearance: Normal appearance. He is well-developed. He is not ill-appearing, toxic-appearing or diaphoretic.  HENT:     Head: Normocephalic and atraumatic.     Right Ear: External ear normal.     Left Ear: External ear normal.     Nose: Nose normal.  Mouth/Throat:     Mouth: Mucous membranes are moist.     Pharynx: Oropharynx is clear. No oropharyngeal exudate or posterior oropharyngeal erythema.  Eyes:     General:        Right eye: No discharge.        Left eye: No discharge.     Conjunctiva/sclera: Conjunctivae normal.  Neck:     Trachea: No tracheal deviation.  Cardiovascular:     Rate and Rhythm: Normal rate and regular rhythm.     Pulses: Normal pulses.     Heart sounds: Normal heart sounds.  Pulmonary:     Effort: Pulmonary effort is normal. No respiratory distress.     Breath sounds: Normal breath sounds. No stridor. No wheezing, rhonchi or rales.   Musculoskeletal:        General: Normal range of motion.  Skin:    General: Skin is warm and dry.     Findings: No rash.  Neurological:     Mental Status: He is alert.     Motor: No abnormal muscle tone.     Coordination: Coordination normal.  Psychiatric:        Mood and Affect: Mood normal.        Behavior: Behavior normal.      Results for orders placed or performed during the hospital encounter of 07/21/22  CBC  Result Value Ref Range   WBC 9.8 4.0 - 10.5 K/uL   RBC 5.98 (H) 4.22 - 5.81 MIL/uL   Hemoglobin 17.1 (H) 13.0 - 17.0 g/dL   HCT 40.9 81.1 - 91.4 %   MCV 84.1 80.0 - 100.0 fL   MCH 28.6 26.0 - 34.0 pg   MCHC 34.0 30.0 - 36.0 g/dL   RDW 78.2 95.6 - 21.3 %   Platelets 272 150 - 400 K/uL   nRBC 0.0 0.0 - 0.2 %  Comprehensive metabolic panel  Result Value Ref Range   Sodium 139 135 - 145 mmol/L   Potassium 3.7 3.5 - 5.1 mmol/L   Chloride 108 98 - 111 mmol/L   CO2 22 22 - 32 mmol/L   Glucose, Bld 98 70 - 99 mg/dL   BUN 10 6 - 20 mg/dL   Creatinine, Ser 0.86 0.61 - 1.24 mg/dL   Calcium 9.1 8.9 - 57.8 mg/dL   Total Protein 7.7 6.5 - 8.1 g/dL   Albumin 4.3 3.5 - 5.0 g/dL   AST 25 15 - 41 U/L   ALT 23 0 - 44 U/L   Alkaline Phosphatase 68 38 - 126 U/L   Total Bilirubin 1.0 0.3 - 1.2 mg/dL   GFR, Estimated >46 >96 mL/min   Anion gap 9 5 - 15  Lipase, blood  Result Value Ref Range   Lipase 51 11 - 51 U/L  Urinalysis, Routine w reflex microscopic -Urine, Clean Catch  Result Value Ref Range   Color, Urine YELLOW (A) YELLOW   APPearance CLEAR (A) CLEAR   Specific Gravity, Urine 1.021 1.005 - 1.030   pH 5.0 5.0 - 8.0   Glucose, UA NEGATIVE NEGATIVE mg/dL   Hgb urine dipstick NEGATIVE NEGATIVE   Bilirubin Urine NEGATIVE NEGATIVE   Ketones, ur NEGATIVE NEGATIVE mg/dL   Protein, ur NEGATIVE NEGATIVE mg/dL   Nitrite NEGATIVE NEGATIVE   Leukocytes,Ua NEGATIVE NEGATIVE  Troponin I (High Sensitivity)  Result Value Ref Range   Troponin I (High Sensitivity) 20 (H)  <18 ng/L  Troponin I (High Sensitivity)  Result Value Ref Range   Troponin I (  High Sensitivity) 19 (H) <18 ng/L       Assessment & Plan:     ICD-10-CM   1. Wheezy  R06.2 predniSONE (DELTASONE) 20 MG tablet    albuterol (VENTOLIN HFA) 108 (90 Base) MCG/ACT inhaler    dextromethorphan-guaiFENesin (MUCINEX DM) 30-600 MG 12hr tablet   reported wheeziness, no wheeze on exam but will tx for bronchitis, recommend close f/up if wheeze/cough not improved to eval other etiologies    2. Bronchitis  J40 predniSONE (DELTASONE) 20 MG tablet    albuterol (VENTOLIN HFA) 108 (90 Base) MCG/ACT inhaler    DG Chest 2 View    dextromethorphan-guaiFENesin (MUCINEX DM) 30-600 MG 12hr tablet   coughing with wheeze x 2 months, tx with steroids, inhalers, mucinex dm, cough meds, f/up if not improving, lungs CTA A&P    3. Chronic left shoulder pain  M25.512 Ambulatory referral to Orthopedic Surgery   G89.29 meloxicam (MOBIC) 15 MG tablet   would like referral to ortho - pain for months, waking him from sleep    4. Gastroesophageal reflux disease, unspecified whether esophagitis present  K21.9 pantoprazole (PROTONIX) 20 MG tablet   GI protection with taking NSAIDs and steroids         Danelle Berry, PA-C 03/15/23 2:48 PM

## 2023-03-15 NOTE — Telephone Encounter (Signed)
Chief Complaint: cough, wheezing  Symptoms: SOB with exertion, nausea. Chest pain with coughing Frequency: 2 weeks Pertinent Negatives: Patient denies chest pain that comes and goes WITHOUT coughing, fever, radiating pain, has h/o chronic left shoulder pain  Disposition: [] ED /[] Urgent Care (no appt availability in office) / [x] Appointment(In office/virtual)/ []  [place] [facility]/ [] Home Care/ [] Refused Recommended Disposition /[] [facility]/ []  Follow-up with PCP Additional Notes: appt today  Reason for Disposition . [1] MILD difficulty breathing (e.g., minimal/no SOB at rest, SOB with walking, pulse <100) AND [2] NEW-onset or WORSE than normal  Answer Assessment - Initial Assessment Questions 1. LOCATION: "Where does it hurt?"       Chest  2. RADIATION: "Does the pain go anywhere else?" (e.g., into neck, jaw, arms, back)     Left shoulder constant 2 years worse with coughing  3. ONSET: "When did the chest pain begin?" (Minutes, hours or days)      With coughing  4. PATTERN: "Does the pain come and go, or has it been constant since it started?"  "Does it get worse with exertion?"      Comes and goes with cough only 5. DURATION: "How long does it last" (e.g., seconds, minutes, hours)     2 weeks  6. SEVERITY: "How bad is the pain?"  (e.g., Scale 1-10; mild, moderate, or severe)    - MILD (1-3): doesn't interfere with normal activities     - MODERATE (4-7): interferes with normal activities or awakens from sleep    - SEVERE (8-10): excruciating pain, unable to do any normal activities       mild 7. CARDIAC RISK FACTORS: "Do you have any history of heart problems or risk factors for heart disease?" (e.g., angina, prior heart attack; diabetes, high blood pressure, high cholesterol, smoker, or strong family history of heart disease)     no 8. PULMONARY RISK FACTORS: "Do you have any history of lung disease?"  (e.g., blood clots in lung, asthma, emphysema, birth control  pills)     no 9. CAUSE: "What do you think is causing the chest pain?"     Sinus infection head acld  10. OTHER SYMPTOMS: "Do you have any other symptoms?" (e.g., dizziness, nausea, vomiting, sweating, fever, difficulty breathing, cough)       Wheezing SOB comes and goes mild, nausea since been, phlegm yellow white, chills and body ache, nasal congestion   11. PREGNANCY: "Is there any chance you are pregnant?" "When was your last menstrual period?"       N/a  Answer Assessment - Initial Assessment Questions 1. RESPIRATORY STATUS: "Describe your breathing?" (e.g., wheezing, shortness of breath, unable to speak, severe coughing)      SOB cough wheezing 2. ONSET: "When did this breathing problem begin?"      2 weeks  3. PATTERN "Does the difficult breathing come and go, or has it been constant since it started?"      Comes and goes 4. SEVERITY: "How bad is your breathing?" (e.g., mild, moderate, severe)    - MILD: No SOB at rest, mild SOB with walking, speaks normally in sentences, can lie down, no retractions, pulse < 100.    - MODERATE: SOB at rest, SOB with minimal exertion and prefers to sit, cannot lie down flat, speaks in phrases, mild retractions, audible wheezing, pulse 100-120.    - SEVERE: Very SOB at rest, speaks in single words, struggling to breathe, sitting hunched forward, retractions, pulse > 120  Mild  6. CARDIAC HISTORY: "Do you have any history of heart disease?" (e.g., heart attack, angina, bypass surgery, angioplasty)      no 7. LUNG HISTORY: "Do you have any history of lung disease?"  (e.g., pulmonary embolus, asthma, emphysema)     no 8. CAUSE: "What do you think is causing the breathing problem?"      Sick for 2 weeks 9. OTHER SYMPTOMS: "Do you have any other symptoms? (e.g., dizziness, runny nose, cough, chest pain, fever)     Cogh up phlegm . Chest pain with coughing, nausea  Protocols used: Chest Pain-A-AH, Breathing Difficulty-A-AH

## 2023-03-16 ENCOUNTER — Ambulatory Visit: Payer: 59 | Admitting: Nurse Practitioner

## 2023-04-06 ENCOUNTER — Other Ambulatory Visit: Payer: Self-pay | Admitting: Family Medicine

## 2023-04-06 DIAGNOSIS — K219 Gastro-esophageal reflux disease without esophagitis: Secondary | ICD-10-CM

## 2023-04-08 ENCOUNTER — Encounter: Payer: Self-pay | Admitting: Emergency Medicine

## 2023-04-08 ENCOUNTER — Emergency Department
Admission: EM | Admit: 2023-04-08 | Discharge: 2023-04-08 | Disposition: A | Discharge status: Home / Self Care | Payer: Medicare HMO | Attending: Emergency Medicine | Admitting: Emergency Medicine

## 2023-04-08 ENCOUNTER — Other Ambulatory Visit: Payer: Self-pay

## 2023-04-08 ENCOUNTER — Emergency Department: Payer: Medicare HMO

## 2023-04-08 DIAGNOSIS — E876 Hypokalemia: Secondary | ICD-10-CM | POA: Insufficient documentation

## 2023-04-08 DIAGNOSIS — F17200 Nicotine dependence, unspecified, uncomplicated: Secondary | ICD-10-CM | POA: Insufficient documentation

## 2023-04-08 DIAGNOSIS — R079 Chest pain, unspecified: Secondary | ICD-10-CM | POA: Diagnosis not present

## 2023-04-08 DIAGNOSIS — R059 Cough, unspecified: Secondary | ICD-10-CM | POA: Diagnosis not present

## 2023-04-08 DIAGNOSIS — R0789 Other chest pain: Secondary | ICD-10-CM | POA: Insufficient documentation

## 2023-04-08 DIAGNOSIS — I10 Essential (primary) hypertension: Secondary | ICD-10-CM | POA: Insufficient documentation

## 2023-04-08 DIAGNOSIS — I2510 Atherosclerotic heart disease of native coronary artery without angina pectoris: Secondary | ICD-10-CM | POA: Insufficient documentation

## 2023-04-08 DIAGNOSIS — R0602 Shortness of breath: Secondary | ICD-10-CM | POA: Diagnosis not present

## 2023-04-08 LAB — CBC
HCT: 47.5 % (ref 39.0–52.0)
Hemoglobin: 16.2 g/dL (ref 13.0–17.0)
MCH: 30.1 pg (ref 26.0–34.0)
MCHC: 34.1 g/dL (ref 30.0–36.0)
MCV: 88.3 fL (ref 80.0–100.0)
Platelets: 222 10*3/uL (ref 150–400)
RBC: 5.38 MIL/uL (ref 4.22–5.81)
RDW: 14.5 % (ref 11.5–15.5)
WBC: 7.2 10*3/uL (ref 4.0–10.5)
nRBC: 0 % (ref 0.0–0.2)

## 2023-04-08 LAB — BASIC METABOLIC PANEL
Anion gap: 11 (ref 5–15)
BUN: 12 mg/dL (ref 6–20)
CO2: 22 mmol/L (ref 22–32)
Calcium: 8.7 mg/dL — ABNORMAL LOW (ref 8.9–10.3)
Chloride: 106 mmol/L (ref 98–111)
Creatinine, Ser: 0.74 mg/dL (ref 0.61–1.24)
GFR, Estimated: >60 mL/min (ref >60)
Glucose, Bld: 140 mg/dL — ABNORMAL HIGH (ref 70–99)
Potassium: 3.4 mmol/L — ABNORMAL LOW (ref 3.5–5.1)
Sodium: 139 mmol/L (ref 135–145)

## 2023-04-08 LAB — TROPONIN I (HIGH SENSITIVITY)
Troponin I (High Sensitivity): 14 ng/L (ref <18)
Troponin I (High Sensitivity): 14 ng/L (ref <18)

## 2023-04-08 NOTE — ED Notes (Signed)
Pt is currently sleeping deeply. NSR on monitor. Will draw second trop once EDP wakes pt up.

## 2023-04-08 NOTE — Discharge Instructions (Signed)
You have been seen in the Emergency Department (ED) today for chest pain.  As we have discussed today’s test results are normal, but you may require further testing. ° °Please follow up with the recommended doctor as instructed above in these documents regarding today’s emergent visit and your recent symptoms to discuss further management.  Continue to take your regular medications. If you are not doing so already, please also take a daily baby aspirin (81 mg), at least until you follow up with your doctor. ° °Return to the Emergency Department (ED) if you experience any further chest pain/pressure/tightness, difficulty breathing, or sudden sweating, or other symptoms that concern you. ° °

## 2023-04-08 NOTE — ED Triage Notes (Signed)
Patient ambulatory to triage with complaints of chest pain and left arm pain x2-3 days. Patient states he was seen a couple of weeks ago for the arm/shoulder pain and given pain medicine which has not helped. He denies other accompanying symptoms to the chest pain. HE does endorse nausea but states he has this regularly and this is not new to his chest pain. States he could not sleep tonight so he decided to come in.

## 2023-04-08 NOTE — ED Provider Notes (Signed)
[facility] Provider Note    Event Date/Time   First MD Initiated Contact with Patient 04/08/23 [phone]     (approximate)   History   Chest Pain   HPI  Howard Jennings is a 52 y.o. male history of hypertension hyperlipidemia   Patient is a smoker.  Does not appear to have a history of coronary disease   Patient reports that for "good while" has been experiencing a achy discomfort feels like a hard to describe achiness in the area of his left shoulder that will radiate slightly down to his left upper arm.  Also times  his left upper chest.  Comes and goes.  Nothing seems to make it particularly better or worse.  No shortness of breath.  No leg swelling.  No history of blood clots.  No recent surgeries or long travels.  Denies shortness of breath  No moving pain.  No back pain  Physical Exam   Triage Vital Signs: ED Triage Vitals  Encounter Vitals Group     BP 04/08/23 0400 (!) 162/108     Systolic BP Percentile --      Diastolic BP Percentile --      Pulse Rate 04/08/23 0400 76     Resp 04/08/23 0400 18     Temp 04/08/23 0400 98.3 F (36.8 C)     Temp src --      SpO2 04/08/23 0400 98 %     Weight 04/08/23 0411 206 lb (93.4 kg)     Height 04/08/23 0411 5\' 6"  (1.676 m)     Head Circumference --      Peak Flow --      Pain Score 04/08/23 0411 8     Pain Loc --      Pain Education --      Exclude from Growth Chart --     Most recent vital signs: Vitals:   04/08/23 0400 04/08/23 0730  BP: (!) 162/108 (!) 146/91  Pulse: 76 65  Resp: 18 (!) 22  Temp: 98.3 F (36.8 C)   SpO2: 98% 100%     General: Awake, no distress.  CV:  Good peripheral perfusion.  Normal tones and rate Resp:  Normal effort.  Clear bilateral Abd:  No distention.  Soft nontender   Patient resting comfortably, actually asleep on room entry.  Awakens easily.  Able to sit himself up.  Demonstrates that discomfort seems to come about through mild range of motion of  left shoulder.  Left shoulder left upper arm are atraumatic normal in appearance.  No swelling or edema.  Strong left radial pulse.  No obvious neck pain.   ED Results / Procedures / Treatments   Labs (all labs ordered are listed, but only abnormal results are displayed) Labs Reviewed  BASIC METABOLIC PANEL - Abnormal; Notable for the following components:      Result Value   Potassium 3.4 (*)    Glucose, Bld 140 (*)    Calcium 8.7 (*)    All other components within normal limits  CBC  TROPONIN I (HIGH SENSITIVITY)  TROPONIN I (HIGH SENSITIVITY)   EKG troponin #1 and #2 are both normal.  Labs notable for very mild hypokalemia  EKG  And interpreted by me at 4 AM heart rate 70 QRS 80 QTc 440 Normal sinus rhythm, mild nonspecific T wave abnormality, given voltages suspect possible repolarization abnormality possible underlying LVH.  Compared to EKG from February 21 of this year, no  major changes found   RADIOLOGY Chest x-ray interpreted by me as negative for acute    PROCEDURES:  Critical Care performed: No  Procedures   MEDICATIONS ORDERED IN ED: Medications - No data to display   IMPRESSION / MDM / ASSESSMENT AND PLAN / ED COURSE  I reviewed the triage vital signs and the nursing notes.                              Differential diagnosis includes, but is not limited to, ACS, aortic dissection, pulmonary embolism, cardiac tamponade, pneumothorax, pneumonia, pericarditis, myocarditis, GI-related causes including esophagitis/gastritis, and musculoskeletal chest wall pain.    Patient reports pain now for several weeks.  Very reassuring examination with troponins.  Clinically doubt this to be ACS.  No notable findings or risk factors suggest dissection, PE, chest x-ray appears normal, no evidence of pneumothorax etc.  He does have risk factors for cardiac disease including smoking history.  EKG also questionably if he might have LVH or underlying chronic hypertension.   Discussed with the patient I would recommend he follow-up closely with cardiology for further stress testing, I placed a referral for him and also given him instructions on how to call the clinic.  In the interim he will take careful return precautions, if worsening or new consistent symptoms or other concerns arise return to the ER in the interim.  Patient's presentation is most consistent with acute complicated illness / injury requiring diagnostic workup.   The patient is on the cardiac monitor to evaluate for evidence of arrhythmia and/or significant heart rate changes.  Return precautions and treatment recommendations and follow-up discussed with the patient who is agreeable with the plan.      FINAL CLINICAL IMPRESSION(S) / ED DIAGNOSES   Final diagnoses:  Chest pain with low risk of acute coronary syndrome     Rx / DC Orders   ED Discharge Orders          Ordered    Ambulatory referral to Cardiology       Comments: If you have not heard from the Cardiology office within the next 72 hours please call [phone].   04/08/23 0981             Note:  This document was prepared using Dragon voice recognition software and may include unintentional dictation errors.   Sharyn Creamer, MD 04/08/23 [phone]

## 2023-04-11 ENCOUNTER — Ambulatory Visit (INDEPENDENT_AMBULATORY_CARE_PROVIDER_SITE_OTHER): Payer: 59 | Admitting: Physician Assistant

## 2023-04-11 DIAGNOSIS — Z91199 Patient's noncompliance with other medical treatment and regimen due to unspecified reason: Secondary | ICD-10-CM

## 2023-04-11 NOTE — Progress Notes (Deleted)
          Acute Office Visit   Patient: Howard Jennings   DOB: [birth year]   52 y.o. Male  MRN: [id number] Visit Date: 04/11/2023  Today's healthcare provider: Oswaldo Conroy Jamesen Stahnke, PA-C  Introduced myself to the patient as a Secondary school teacher and provided education on APPs in clinical practice.    No chief complaint on file.  Subjective    HPI    Medications: Outpatient Medications Prior to Visit  Medication Sig   albuterol (VENTOLIN HFA) 108 (90 Base) MCG/ACT inhaler Inhale 2 puffs into the lungs every 4 (four) hours as needed for wheezing or shortness of breath.   dextromethorphan-guaiFENesin (MUCINEX DM) 30-600 MG 12hr tablet Take 1 tablet by mouth 2 (two) times daily.   hydrochlorothiazide (HYDRODIURIL) 12.5 MG tablet Take 1 tablet (12.5 mg total) by mouth daily.   meloxicam (MOBIC) 15 MG tablet Take 1 tablet (15 mg total) by mouth daily.   methocarbamol (ROBAXIN-750) 750 MG tablet Take 1 tablet (750 mg total) by mouth every 8 (eight) hours as needed for muscle spasms.   nicotine (NICODERM CQ - DOSED IN MG/24 HR) 7 mg/24hr patch Place 1 patch (7 mg total) onto the skin daily.   nicotine polacrilex (NICOTINE MINI) 4 MG lozenge Take 1 lozenge (4 mg total) by mouth as needed for smoking cessation.   pantoprazole (PROTONIX) 20 MG tablet TAKE 1 TABLET (20 MG TOTAL) BY MOUTH DAILY AS NEEDED FOR INDIGESTION OR HEARTBURN (TAKE IN AM BEFORE BREAKFAST TO PROTECT STOMACH WITH NSAIDS).   QUEtiapine (SEROQUEL) 50 MG tablet Take by mouth.   No facility-administered medications prior to visit.    Review of Systems  {Insert previous labs (optional):23779} {See past labs  Heme  Chem  Endocrine  Serology  Results Review (optional):1}   Objective    There were no vitals taken for this visit. {Insert last BP/Wt (optional):23777}{See vitals history (optional):1}   Physical Exam    No results found for any visits on 04/11/23.  Assessment & Plan      No follow-ups on file.

## 2023-04-11 NOTE — Progress Notes (Unsigned)
No show for apt.

## 2023-04-14 ENCOUNTER — Ambulatory Visit: Payer: Medicare HMO | Admitting: Physician Assistant

## 2023-04-17 ENCOUNTER — Ambulatory Visit: Payer: Medicare HMO | Admitting: Physician Assistant

## 2023-04-22 ENCOUNTER — Other Ambulatory Visit: Payer: Self-pay

## 2023-04-22 ENCOUNTER — Emergency Department: Payer: Medicare HMO

## 2023-04-22 DIAGNOSIS — M545 Low back pain, unspecified: Secondary | ICD-10-CM | POA: Diagnosis not present

## 2023-04-22 DIAGNOSIS — K429 Umbilical hernia without obstruction or gangrene: Secondary | ICD-10-CM | POA: Diagnosis not present

## 2023-04-22 DIAGNOSIS — M5459 Other low back pain: Secondary | ICD-10-CM | POA: Diagnosis not present

## 2023-04-22 DIAGNOSIS — R109 Unspecified abdominal pain: Secondary | ICD-10-CM | POA: Diagnosis not present

## 2023-04-22 DIAGNOSIS — N3289 Other specified disorders of bladder: Secondary | ICD-10-CM | POA: Diagnosis not present

## 2023-04-22 DIAGNOSIS — M549 Dorsalgia, unspecified: Secondary | ICD-10-CM | POA: Diagnosis present

## 2023-04-22 DIAGNOSIS — M546 Pain in thoracic spine: Secondary | ICD-10-CM | POA: Diagnosis not present

## 2023-04-22 LAB — URINALYSIS, ROUTINE W REFLEX MICROSCOPIC
Bilirubin Urine: NEGATIVE
Glucose, UA: NEGATIVE mg/dL
Hgb urine dipstick: NEGATIVE
Ketones, ur: 5 mg/dL — AB
Leukocytes,Ua: NEGATIVE
Nitrite: NEGATIVE
Protein, ur: NEGATIVE mg/dL
Specific Gravity, Urine: 1.027 (ref 1.005–1.030)
pH: 5 (ref 5.0–8.0)

## 2023-04-22 LAB — BASIC METABOLIC PANEL
Anion gap: 12 (ref 5–15)
BUN: 11 mg/dL (ref 6–20)
CO2: 22 mmol/L (ref 22–32)
Calcium: 8.8 mg/dL — ABNORMAL LOW (ref 8.9–10.3)
Chloride: 106 mmol/L (ref 98–111)
Creatinine, Ser: 0.83 mg/dL (ref 0.61–1.24)
GFR, Estimated: >60 mL/min (ref >60)
Glucose, Bld: 100 mg/dL — ABNORMAL HIGH (ref 70–99)
Potassium: 3.4 mmol/L — ABNORMAL LOW (ref 3.5–5.1)
Sodium: 140 mmol/L (ref 135–145)

## 2023-04-22 LAB — CBC
HCT: 51.1 % (ref 39.0–52.0)
Hemoglobin: 17.7 g/dL — ABNORMAL HIGH (ref 13.0–17.0)
MCH: 29.8 pg (ref 26.0–34.0)
MCHC: 34.6 g/dL (ref 30.0–36.0)
MCV: 86.2 fL (ref 80.0–100.0)
Platelets: 263 10*3/uL (ref 150–400)
RBC: 5.93 MIL/uL — ABNORMAL HIGH (ref 4.22–5.81)
RDW: 14.2 % (ref 11.5–15.5)
WBC: 8.1 10*3/uL (ref 4.0–10.5)
nRBC: 0 % (ref 0.0–0.2)

## 2023-04-22 NOTE — ED Triage Notes (Signed)
Pt to ed from home via POV for right sided flank pain that started this morning. Pt is caox4, in no acute distress and ambulatory in triage. Pt had just urinated in lobby before being triage. Will obtain urine later.

## 2023-04-23 ENCOUNTER — Emergency Department
Admission: EM | Admit: 2023-04-23 | Discharge: 2023-04-23 | Disposition: A | Discharge status: Home / Self Care | Payer: Medicare HMO | Attending: Emergency Medicine | Admitting: Emergency Medicine

## 2023-04-23 DIAGNOSIS — M545 Low back pain, unspecified: Secondary | ICD-10-CM

## 2023-04-23 MED ORDER — LIDOCAINE 5 % EX PTCH
1.0000 | MEDICATED_PATCH | Freq: Once | CUTANEOUS | Status: DC
  Administered 2023-04-23: 1 via TRANSDERMAL
  Filled 2023-04-23: qty 1

## 2023-04-23 MED ORDER — METHOCARBAMOL 500 MG PO TABS: 500.0000 mg | ORAL_TABLET | Freq: Three times a day (TID) | ORAL | 0 refills | Status: DC | PRN

## 2023-04-23 MED ORDER — METHOCARBAMOL 500 MG PO TABS
1000.0000 mg | ORAL_TABLET | Freq: Once | ORAL | Status: AC
  Administered 2023-04-23: 1000 mg via ORAL
  Filled 2023-04-23: qty 2

## 2023-04-23 MED ORDER — LIDOCAINE 5 % EX PTCH: 1.0000 | MEDICATED_PATCH | Freq: Two times a day (BID) | CUTANEOUS | 0 refills | Status: DC

## 2023-04-23 NOTE — ED Provider Notes (Signed)
[facility] Provider Note    Event Date/Time   First MD Initiated Contact with Patient 04/23/23 [phone]     (approximate)   History   Flank Pain   HPI  Howard Jennings is a 52 y.o. male with no significant past medical history who presents to the emergency department with right back pain.  Pain is worse with movement.  No injury.  No numbness, tingling, weakness, bowel or bladder incontinence.  No previous back surgery or epidural injection.  No fever.  Urinary symptoms.  No history of kidney stone.  Took ibuprofen at home without relief.   History provided by patient, family.    History reviewed. No pertinent past medical history.  Past Surgical History:  Procedure Laterality Date   COLONOSCOPY WITH PROPOFOL N/A 07/06/2022   Procedure: COLONOSCOPY WITH PROPOFOL;  Surgeon: Toney Reil, MD;  Location: [facility] ENDOSCOPY;  Service: Gastroenterology;  Laterality: N/A;    MEDICATIONS:  Prior to Admission medications   Medication Sig Start Date End Date Taking? Authorizing Provider  lidocaine (LIDODERM) 5 % Place 1 patch onto the skin every 12 (twelve) hours. Remove & Discard patch within 12 hours or as directed by MD 04/23/23 04/22/24 Yes Johncarlos Holtsclaw, Layla Maw, DO  methocarbamol (ROBAXIN) 500 MG tablet Take 1 tablet (500 mg total) by mouth every 8 (eight) hours as needed for muscle spasms. 04/23/23  Yes Yamira Papa, Baxter Hire N, DO  albuterol (VENTOLIN HFA) 108 (90 Base) MCG/ACT inhaler Inhale 2 puffs into the lungs every 4 (four) hours as needed for wheezing or shortness of breath. 03/15/23   Danelle Berry, PA-C  dextromethorphan-guaiFENesin (MUCINEX DM) 30-600 MG 12hr tablet Take 1 tablet by mouth 2 (two) times daily. 03/15/23   Danelle Berry, PA-C  hydrochlorothiazide (HYDRODIURIL) 12.5 MG tablet Take 1 tablet (12.5 mg total) by mouth daily. 07/21/22 10/19/22  Pilar Jarvis, MD  meloxicam (MOBIC) 15 MG tablet Take 1 tablet (15 mg total) by mouth daily. 03/15/23   Danelle Berry, PA-C  nicotine (NICODERM CQ - DOSED IN MG/24 HR) 7 mg/24hr patch Place 1 patch (7 mg total) onto the skin daily. 07/21/22 07/21/23  Pilar Jarvis, MD  nicotine polacrilex (NICOTINE MINI) 4 MG lozenge Take 1 lozenge (4 mg total) by mouth as needed for smoking cessation. 07/21/22   Pilar Jarvis, MD  pantoprazole (PROTONIX) 20 MG tablet TAKE 1 TABLET (20 MG TOTAL) BY MOUTH DAILY AS NEEDED FOR INDIGESTION OR HEARTBURN (TAKE IN AM BEFORE BREAKFAST TO PROTECT STOMACH WITH NSAIDS). 04/06/23   Berniece Salines, FNP  QUEtiapine (SEROQUEL) 50 MG tablet Take by mouth. 03/07/22   [provider]    Physical Exam   Triage Vital Signs: ED Triage Vitals  Encounter Vitals Group     BP 04/22/23 2034 (!) 149/100     Systolic BP Percentile --      Diastolic BP Percentile --      Pulse Rate 04/22/23 2034 92     Resp 04/22/23 2034 16     Temp 04/22/23 2034 98.1 F (36.7 C)     Temp Source 04/22/23 2034 Oral     SpO2 04/22/23 2034 98 %     Weight --      Height 04/22/23 2032 5\' 6"  (1.676 m)     Head Circumference --      Peak Flow --      Pain Score 04/22/23 2032 10     Pain Loc --      Pain Education --  Exclude from Growth Chart --     Most recent vital signs: Vitals:   04/22/23 2034 04/23/23 0042  BP: (!) 149/100 (!) 146/103  Pulse: 92 87  Resp: 16 16  Temp: 98.1 F (36.7 C) 98.1 F (36.7 C)  SpO2: 98% 97%    CONSTITUTIONAL: Alert, responds appropriately to questions. Well-appearing; well-nourished HEAD: Normocephalic, atraumatic EYES: Conjunctivae clear, pupils appear equal, sclera nonicteric ENT: normal nose; moist mucous membranes NECK: Supple, normal ROM CARD: RRR; S1 and S2 appreciated RESP: Normal chest excursion without splinting or tachypnea; breath sounds clear and equal bilaterally; no wheezes, no rhonchi, no rales, no hypoxia or respiratory distress, speaking full sentences ABD/GI: Non-distended; soft, non-tender, no rebound, no guarding, no peritoneal  signs BACK: The back appears normal, tender to palpation over the right thoracic paraspinal muscles, no overlying skin changes, no midline spinal tenderness, step-off or deformity EXT: Normal ROM in all joints; no deformity noted, no edema SKIN: Normal color for age and race; warm; no rash on exposed skin NEURO: Moves all extremities equally, normal speech, normal gait, normal sensation, no saddle anesthesia, 2+ deep tendon reflexes in bilateral lower extremities, no clonus PSYCH: The patient's mood and manner are appropriate.   ED Results / Procedures / Treatments   LABS: (all labs ordered are listed, but only abnormal results are displayed) Labs Reviewed  URINALYSIS, ROUTINE W REFLEX MICROSCOPIC - Abnormal; Notable for the following components:      Result Value   Color, Urine YELLOW (*)    APPearance CLEAR (*)    Ketones, ur 5 (*)    All other components within normal limits  BASIC METABOLIC PANEL - Abnormal; Notable for the following components:   Potassium 3.4 (*)    Glucose, Bld 100 (*)    Calcium 8.8 (*)    All other components within normal limits  CBC - Abnormal; Notable for the following components:   RBC 5.93 (*)    Hemoglobin 17.7 (*)    All other components within normal limits     EKG:  RADIOLOGY: My personal review and interpretation of imaging: CT scan shows no acute abnormality.  I have personally reviewed all radiology reports.   CT Renal Stone Study  Result Date: 04/22/2023 CLINICAL DATA:  Abdominal and flank pain EXAM: CT ABDOMEN AND PELVIS WITHOUT CONTRAST TECHNIQUE: Multidetector CT imaging of the abdomen and pelvis was performed following the standard protocol without IV contrast. RADIATION DOSE REDUCTION: This exam was performed according to the departmental dose-optimization program which includes automated exposure control, adjustment of the mA and/or kV according to patient size and/or use of iterative reconstruction technique. COMPARISON:  CT  abdomen and pelvis 07/21/2022 FINDINGS: Lower chest: No acute abnormality. Hepatobiliary: No focal liver abnormality is seen. No gallstones, gallbladder wall thickening, or biliary dilatation. Pancreas: Unremarkable. No pancreatic ductal dilatation or surrounding inflammatory changes. Spleen: Normal in size without focal abnormality. Adrenals/Urinary Tract: Bilateral adrenal nodularity is unchanged. No urinary tract calculi or hydronephrosis. The bladder is under distended. Bladder wall thickening can not be excluded. Stomach/Bowel: Stomach is within normal limits. Appendix appears normal. No evidence of bowel wall thickening, distention, or inflammatory changes. Vascular/Lymphatic: Aortic atherosclerosis. No enlarged abdominal or pelvic lymph nodes. Reproductive: Prostate is unremarkable. Other: There is a small fat containing umbilical hernia. There is no free fluid. Musculoskeletal: No acute or significant osseous findings. IMPRESSION: 1. No acute localizing process in the abdomen or pelvis. 2. No urinary tract calculi or hydronephrosis. 3. Bladder wall thickening can not be  excluded. Correlate clinically for cystitis. 4. Stable bilateral adrenal nodularity. 5. Aortic atherosclerosis. Aortic Atherosclerosis (ICD10-I70.0). Electronically Signed   By: Darliss Cheney M.D.   On: 04/22/2023 21:14     PROCEDURES:  Critical Care performed: No     Procedures    IMPRESSION / MDM / ASSESSMENT AND PLAN / ED COURSE  I reviewed the triage vital signs and the nursing notes.    Patient here with right sided flank pain.    DIFFERENTIAL DIAGNOSIS (includes but not limited to):   Skill skeletal pain, kidney stone, UTI, less likely appendicitis, doubt cauda equina, epidural abscess or hematoma, discitis or osteomyelitis, transverse myelitis, fracture   Patient's presentation is most consistent with acute complicated illness / injury requiring diagnostic workup.   PLAN: Workup initiated from triage.  No  leukocytosis.  Normal electrolytes, creatinine.  Urine shows no sign of infection or blood.  CT scan also obtained from triage reviewed and interpreted by myself and the radiologist and shows no acute abnormality.  Spine appears normal.  Appendix normal.  No kidney stone.  There may be some wall thickening of the bladder but urine is unremarkable.  He is not having urinary symptoms.  Doubt cystitis.  I feel this is likely more musculoskeletal in nature.  No red flag symptoms today.  Will discharge with Robaxin, ibuprofen, Lidoderm patches.  Patient comfortable with this plan.  Commended stretching, alternating heat and ice, massage.   MEDICATIONS GIVEN IN ED: Medications  lidocaine (LIDODERM) 5 % 1 patch (1 patch Transdermal Patch Applied 04/23/23 0103)  methocarbamol (ROBAXIN) tablet 1,000 mg (1,000 mg Oral Given 04/23/23 0103)     ED COURSE:  At this time, I do not feel there is any life-threatening condition present. I reviewed all nursing notes, vitals, pertinent previous records.  All lab and urine results, EKGs, imaging ordered have been independently reviewed and interpreted by myself.  I reviewed all available radiology reports from any imaging ordered this visit.  Based on my assessment, I feel the patient is safe to be discharged home without further emergent workup and can continue workup as an outpatient as needed. Discussed all findings, treatment plan as well as usual and customary return precautions.  They verbalize understanding and are comfortable with this plan.  Outpatient follow-up has been provided as needed.  All questions have been answered.    CONSULTS:  none   OUTSIDE RECORDS REVIEWED: Reviewed last internal medicine note on 03/15/2023.       FINAL CLINICAL IMPRESSION(S) / ED DIAGNOSES   Final diagnoses:  Acute right-sided low back pain without sciatica     Rx / DC Orders   ED Discharge Orders          Ordered    lidocaine (LIDODERM) 5 %  Every 12 hours         04/23/23 0049    methocarbamol (ROBAXIN) 500 MG tablet  Every 8 hours PRN        04/23/23 0049             Note:  This document was prepared using Dragon voice recognition software and may include unintentional dictation errors.   Mahli Glahn, Layla Maw, DO 04/23/23 [phone]

## 2023-04-23 NOTE — Discharge Instructions (Addendum)
You may alternate Tylenol 1000 mg every 6 hours as needed for pain, fever and Ibuprofen 800 mg every 6-8 hours as needed for pain, fever.  Please take Ibuprofen with food.  Do not take more than 4000 mg of Tylenol (acetaminophen) in a 24 hour period. ° °

## 2023-05-18 ENCOUNTER — Telehealth: Payer: Self-pay

## 2023-05-18 NOTE — Progress Notes (Signed)
Transition Care Management Unsuccessful Follow-up Telephone Call  Date of discharge and from where:  [facility] 11/24  Attempts:  1st Attempt  Reason for unsuccessful TCM follow-up call:  No answer/busy   Lenard Forth [place]  [facility], [facility] Guide, Phone: [phone] Website: [email or web address]

## 2023-05-18 NOTE — Progress Notes (Signed)
Transition Care Management Unsuccessful Follow-up Telephone Call  Date of discharge and from where:   11/24  Attempts:  2nd Attempt  Reason for unsuccessful TCM follow-up call:  No answer/busy   Lenard Forth [place]  [facility], [facility] Guide, Phone: [phone] Website: [email or web address]

## 2023-05-29 ENCOUNTER — Ambulatory Visit: Payer: Self-pay | Admitting: *Deleted

## 2023-05-29 NOTE — Telephone Encounter (Signed)
  Chief Complaint: Elevated BP  190/111 just before this call.    (Used to be on BP medications but they took me off of it because my BP was "perfect" when I was there before. Symptoms: No symptoms Frequency: Friday it was checked by a friend and it was elevated Pertinent Negatives: Patient denies headaches, dizziness, vision changes.   No chest pain Disposition: [] ED /[] Urgent Care (no appt availability in office) / [x] Appointment(In office/virtual)/ []  [facility]/ [] Home Care/ [] Refused Recommended Disposition /[] [facility]/ []  Follow-up with PCP Additional Notes: Appt made with Della Goo, FNP for 06/01/2023 at 9:00.   He ended the call prior to me being able to given him s/s to go to the ED.
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
## 2023-06-01 ENCOUNTER — Encounter: Payer: Self-pay | Admitting: Nurse Practitioner

## 2023-06-01 ENCOUNTER — Ambulatory Visit (INDEPENDENT_AMBULATORY_CARE_PROVIDER_SITE_OTHER): Payer: 59 | Admitting: Nurse Practitioner

## 2023-06-01 ENCOUNTER — Other Ambulatory Visit: Payer: Self-pay

## 2023-06-01 ENCOUNTER — Other Ambulatory Visit (HOSPITAL_COMMUNITY)
Admission: RE | Admit: 2023-06-01 | Discharge: 2023-06-01 | Disposition: A | Discharge status: Home / Self Care | Payer: 59 | Source: Ambulatory Visit | Attending: Nurse Practitioner | Admitting: Nurse Practitioner

## 2023-06-01 VITALS — BP 160/80 | HR 82 | Temp 98.0°F | Resp 18 | Ht 66.0 in | Wt 202.9 lb

## 2023-06-01 DIAGNOSIS — R030 Elevated blood-pressure reading, without diagnosis of hypertension: Secondary | ICD-10-CM

## 2023-06-01 DIAGNOSIS — M25512 Pain in left shoulder: Secondary | ICD-10-CM

## 2023-06-01 DIAGNOSIS — I10 Essential (primary) hypertension: Secondary | ICD-10-CM | POA: Diagnosis not present

## 2023-06-01 DIAGNOSIS — R350 Frequency of micturition: Secondary | ICD-10-CM

## 2023-06-01 DIAGNOSIS — G8929 Other chronic pain: Secondary | ICD-10-CM | POA: Diagnosis not present

## 2023-06-01 LAB — POCT URINALYSIS DIPSTICK
Bilirubin, UA: NEGATIVE
Glucose, UA: NEGATIVE
Ketones, UA: NEGATIVE
Nitrite, UA: NEGATIVE
Protein, UA: POSITIVE — AB
Spec Grav, UA: 1.02 (ref 1.010–1.025)
Urobilinogen, UA: 0.2 U/dL
pH, UA: 6 (ref 5.0–8.0)

## 2023-06-01 LAB — URINE CYTOLOGY ANCILLARY ONLY
Chlamydia: NEGATIVE
Comment: NEGATIVE
Comment: NORMAL
Neisseria Gonorrhea: NEGATIVE

## 2023-06-01 MED ORDER — CEPHALEXIN 500 MG PO CAPS: 500.0000 mg | ORAL_CAPSULE | Freq: Two times a day (BID) | ORAL | 0 refills | Status: AC

## 2023-06-01 MED ORDER — HYDROCHLOROTHIAZIDE 12.5 MG PO TABS
12.5000 mg | ORAL_TABLET | Freq: Every day | ORAL | 2 refills | Status: DC
Start: 2023-06-01 — End: 2023-08-31

## 2023-06-01 MED ORDER — NAPROXEN 500 MG PO TABS: 500.0000 mg | ORAL_TABLET | Freq: Two times a day (BID) | ORAL | 0 refills | Status: DC

## 2023-06-01 NOTE — Patient Instructions (Addendum)
 [facility] Address: [address] [place], [place], [place] [address] Phone: [phone]

## 2023-06-01 NOTE — Progress Notes (Signed)
 BP (!) 160/80   Pulse 82   Temp 98 F (36.7 C) (Oral)   Resp 18   Ht 5' 6 (1.676 m)   Wt 202 lb 14.4 oz (92 kg)   SpO2 98%   BMI 32.75 kg/m    Subjective:    Patient ID: Howard Jennings, male    DOB: November 08, 1970, 53 y.o.   MRN: [id number]  HPI: DEMONTREZ Jennings is a 53 y.o. male  Chief Complaint  Patient presents with   Hypertension    1892/110 and 190/111   Shoulder Pain    Left shoulder, arm get cold, cannot lift, pain radiates down arm   Urinary Frequency    Discussed the use of AI scribe software for clinical note transcription with the patient, who gave verbal consent to proceed.  History of Present Illness   The patient, with a history of hypertension, has been off medication for over a year. He reports recent blood pressure readings as high as 189/110 and 190/111, with today's reading at 160/84. He denies any associated symptoms such as chest pain, shortness of breath, or headaches. He was previously on hydrochlorothiazide  and lisinopril, which he reports made him feel sleepy and drowsy.  In addition to hypertension, the patient is experiencing severe shoulder pain that is affecting his sleep. He has previously seen an orthopedic specialist for this issue. he reports he has had this pain for years.  Denies any trauma. he says the pain does radiate down his arm.   The patient also reports frequent urination over the past one to two weeks, without associated pain, fever, or back pain. Urinalysis shows blood, protein, and leukocytes, suggesting a urinary tract infection.       06/01/2023    8:51 AM 03/15/2023    2:54 PM 08/09/2022    3:17 PM  Depression screen PHQ 2/9  Decreased Interest 0 0 1  Down, Depressed, Hopeless 0 0 1  PHQ - 2 Score 0 0 2  Altered sleeping   1  Tired, decreased energy   1  Change in appetite   1  Feeling bad or failure about yourself    1  Trouble concentrating   1  Moving slowly or fidgety/restless   1  Suicidal thoughts   0   PHQ-9 Score   8    Relevant past medical, surgical, family and social history reviewed and updated as indicated. Interim medical history since our last visit reviewed. Allergies and medications reviewed and updated.  Review of Systems  Constitutional: Negative for fever or weight change.  Respiratory: Negative for cough and shortness of breath.   Cardiovascular: Negative for chest pain or palpitations.  Gastrointestinal: Negative for abdominal pain, no bowel changes.  Musculoskeletal: Negative for gait problem or joint swelling. Left shoulder pain Skin: Negative for rash.  Neurological: Negative for dizziness or headache.  No other specific complaints in a complete review of systems (except as listed in HPI above).      Objective:    BP (!) 160/80   Pulse 82   Temp 98 F (36.7 C) (Oral)   Resp 18   Ht 5' 6 (1.676 m)   Wt 202 lb 14.4 oz (92 kg)   SpO2 98%   BMI 32.75 kg/m   BP Readings from Last 3 Encounters:  06/01/23 (!) 160/80  04/23/23 (!) 146/103  04/08/23 (!) 133/92     Wt Readings from Last 3 Encounters:  06/01/23 202 lb 14.4 oz (92 kg)  04/08/23 206 lb (93.4 kg)  03/15/23 208 lb (94.3 kg)    Physical Exam  Constitutional: Patient appears well-developed and well-nourished. Obese  No distress.  HEENT: head atraumatic, normocephalic, pupils equal and reactive to light, neck supple Cardiovascular: Normal rate, regular rhythm and normal heart sounds.  No murmur heard. No BLE edema. Pulmonary/Chest: Effort normal and breath sounds normal. No respiratory distress. Abdominal: Soft.  There is no tenderness. MSK: decrease range of motion of left shoulder Psychiatric: Patient has a normal mood and affect. behavior is normal. Judgment and thought content normal.  Results for orders placed or performed in visit on 06/01/23  POCT urinalysis dipstick   Collection Time: 06/01/23  9:00 AM  Result Value Ref Range   Color, UA yellow    Clarity, UA cloudy    Glucose, UA  Negative Negative   Bilirubin, UA negative    Ketones, UA negative    Spec Grav, UA 1.020 1.010 - 1.025   Blood, UA trace    pH, UA 6.0 5.0 - 8.0   Protein, UA Positive (A) Negative   Urobilinogen, UA 0.2 0.2 or 1.0 E.U./dL   Nitrite, UA negative    Leukocytes, UA Small (1+) (A) Negative   Appearance cloudy    Odor none    Last CBC Lab Results  Component Value Date   WBC 8.1 04/22/2023   HGB 17.7 (H) 04/22/2023   HCT 51.1 04/22/2023   MCV 86.2 04/22/2023   MCH 29.8 04/22/2023   RDW 14.2 04/22/2023   PLT 263 04/22/2023   Last metabolic panel Lab Results  Component Value Date   GLUCOSE 100 (H) 04/22/2023   NA 140 04/22/2023   K 3.4 (L) 04/22/2023   CL 106 04/22/2023   CO2 22 04/22/2023   BUN 11 04/22/2023   CREATININE 0.83 04/22/2023   GFRNONAA >60 04/22/2023   CALCIUM 8.8 (L) 04/22/2023   PROT 7.7 07/20/2022   ALBUMIN 4.3 07/20/2022   BILITOT 1.0 07/20/2022   ALKPHOS 68 07/20/2022   AST 25 07/20/2022   ALT 23 07/20/2022   ANIONGAP 12 04/22/2023        Assessment & Plan:   Problem List Items Addressed This Visit       Cardiovascular and Mediastinum   Primary hypertension - Primary   Relevant Medications   hydrochlorothiazide  (HYDRODIURIL ) 12.5 MG tablet   Other Visit Diagnoses       Elevated blood pressure reading       Relevant Medications   hydrochlorothiazide  (HYDRODIURIL ) 12.5 MG tablet     Frequent urination       Relevant Medications   cephALEXin  (KEFLEX ) 500 MG capsule   Other Relevant Orders   POCT urinalysis dipstick (Completed)   Urine Culture   Urine cytology ancillary only     Chronic left shoulder pain       Relevant Medications   naproxen  (NAPROSYN ) 500 MG tablet   Other Relevant Orders   Ambulatory referral to Orthopedic Surgery        Assessment and Plan    Hypertension Uncontrolled with readings as high as 189/110 and 190/111. Patient has been off medication for over a year. Discussed restarting antihypertensive  therapy. -Start Hydrochlorothiazide . -Check blood pressure in 4 weeks.  Shoulder Pain Chronic, severe, and interfering with sleep. No recent orthopedic evaluation. -Refer to [facility]. -Prescribe Naproxen  for pain control.  Urinary Frequency New onset over the past 1-2 weeks. Urinalysis shows blood, protein, and leukocytes suggestive of a urinary tract infection. -Start  antibiotic therapy. -Send urine for culture. -Order urine cytology.        Follow up plan: Return in about 4 weeks (around 06/29/2023) for follow up.

## 2023-06-02 LAB — URINE CULTURE
MICRO NUMBER:: 15911268
Result:: NO GROWTH
SPECIMEN QUALITY:: ADEQUATE

## 2023-06-29 ENCOUNTER — Other Ambulatory Visit: Payer: Self-pay

## 2023-06-29 ENCOUNTER — Ambulatory Visit: Payer: 59 | Admitting: Nurse Practitioner

## 2023-06-29 ENCOUNTER — Emergency Department: Payer: 59

## 2023-06-29 ENCOUNTER — Emergency Department
Admission: EM | Admit: 2023-06-29 | Discharge: 2023-06-29 | Disposition: A | Discharge status: Home / Self Care | Payer: 59 | Attending: Emergency Medicine | Admitting: Emergency Medicine

## 2023-06-29 DIAGNOSIS — M542 Cervicalgia: Secondary | ICD-10-CM | POA: Diagnosis not present

## 2023-06-29 DIAGNOSIS — Y92410 Unspecified street and highway as the place of occurrence of the external cause: Secondary | ICD-10-CM | POA: Diagnosis not present

## 2023-06-29 DIAGNOSIS — S161XXA Strain of muscle, fascia and tendon at neck level, initial encounter: Secondary | ICD-10-CM | POA: Diagnosis not present

## 2023-06-29 DIAGNOSIS — S199XXA Unspecified injury of neck, initial encounter: Secondary | ICD-10-CM | POA: Diagnosis not present

## 2023-06-29 DIAGNOSIS — S0990XA Unspecified injury of head, initial encounter: Secondary | ICD-10-CM | POA: Diagnosis not present

## 2023-06-29 DIAGNOSIS — M549 Dorsalgia, unspecified: Secondary | ICD-10-CM | POA: Insufficient documentation

## 2023-06-29 MED ORDER — CYCLOBENZAPRINE HCL 10 MG PO TABS: 10.0000 mg | ORAL_TABLET | Freq: Three times a day (TID) | ORAL | 0 refills | Status: DC | PRN

## 2023-06-29 MED ORDER — HYDROCODONE-ACETAMINOPHEN 5-325 MG PO TABS
1.0000 | ORAL_TABLET | Freq: Once | ORAL | Status: AC
Start: 2023-06-29 — End: 2023-06-29
  Administered 2023-06-29: 1 via ORAL
  Filled 2023-06-29: qty 1

## 2023-06-29 MED ORDER — LIDOCAINE 5 % EX PTCH
1.0000 | MEDICATED_PATCH | Freq: Once | CUTANEOUS | Status: DC
  Administered 2023-06-29: 1 via TRANSDERMAL
  Filled 2023-06-29: qty 1
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
## 2023-06-29 NOTE — Progress Notes (Deleted)
   There were no vitals taken for this visit.   Subjective:    Patient ID: Howard Jennings, male    DOB: November 04, 1970, 53 y.o.   MRN: [id number]  HPI: Howard Jennings is a 53 y.o. male  No chief complaint on file.   Discussed the use of AI scribe software for clinical note transcription with the patient, who gave verbal consent to proceed.  History of Present Illness           06/01/2023    8:51 AM 03/15/2023    2:54 PM 08/09/2022    3:17 PM  Depression screen PHQ 2/9  Decreased Interest 0 0 1  Down, Depressed, Hopeless 0 0 1  PHQ - 2 Score 0 0 2  Altered sleeping   1  Tired, decreased energy   1  Change in appetite   1  Feeling bad or failure about yourself    1  Trouble concentrating   1  Moving slowly or fidgety/restless   1  Suicidal thoughts   0  PHQ-9 Score   8    Relevant past medical, surgical, family and social history reviewed and updated as indicated. Interim medical history since our last visit reviewed. Allergies and medications reviewed and updated.  Review of Systems  Per HPI unless specifically indicated above     Objective:    There were no vitals taken for this visit.  {Vitals History (Optional):23777} Wt Readings from Last 3 Encounters:  06/29/23 202 lb (91.6 kg)  06/01/23 202 lb 14.4 oz (92 kg)  04/08/23 206 lb (93.4 kg)    Physical Exam  Results for orders placed or performed in visit on 06/01/23  POCT urinalysis dipstick   Collection Time: 06/01/23  9:00 AM  Result Value Ref Range   Color, UA yellow    Clarity, UA cloudy    Glucose, UA Negative Negative   Bilirubin, UA negative    Ketones, UA negative    Spec Grav, UA 1.020 1.010 - 1.025   Blood, UA trace    pH, UA 6.0 5.0 - 8.0   Protein, UA Positive (A) Negative   Urobilinogen, UA 0.2 0.2 or 1.0 E.U./dL   Nitrite, UA negative    Leukocytes, UA Small (1+) (A) Negative   Appearance cloudy    Odor none   Urine cytology ancillary only   Collection Time: 06/01/23  9:13 AM   Result Value Ref Range   Neisseria Gonorrhea Negative    Chlamydia Negative    Comment Normal Reference Ranger Chlamydia - Negative    Comment      Normal Reference Range Neisseria Gonorrhea - Negative  Urine Culture   Collection Time: 06/01/23 10:21 AM   Specimen: Urine  Result Value Ref Range   MICRO NUMBER: [id number]    SPECIMEN QUALITY: Adequate    Sample Source URINE    STATUS: FINAL    Result: No Growth    {Labs (Optional):23779}    Assessment & Plan:   Problem List Items Addressed This Visit   None    Assessment and Plan             Follow up plan: No follow-ups on file.

## 2023-07-13 ENCOUNTER — Emergency Department: Payer: 59

## 2023-07-13 ENCOUNTER — Encounter: Payer: Self-pay | Admitting: Emergency Medicine

## 2023-07-13 ENCOUNTER — Other Ambulatory Visit: Payer: Self-pay

## 2023-07-13 DIAGNOSIS — R079 Chest pain, unspecified: Secondary | ICD-10-CM | POA: Diagnosis not present

## 2023-07-13 DIAGNOSIS — S39012A Strain of muscle, fascia and tendon of lower back, initial encounter: Secondary | ICD-10-CM | POA: Diagnosis not present

## 2023-07-13 DIAGNOSIS — S161XXA Strain of muscle, fascia and tendon at neck level, initial encounter: Secondary | ICD-10-CM | POA: Insufficient documentation

## 2023-07-13 DIAGNOSIS — I10 Essential (primary) hypertension: Secondary | ICD-10-CM | POA: Insufficient documentation

## 2023-07-13 DIAGNOSIS — M546 Pain in thoracic spine: Secondary | ICD-10-CM | POA: Insufficient documentation

## 2023-07-13 DIAGNOSIS — Y92410 Unspecified street and highway as the place of occurrence of the external cause: Secondary | ICD-10-CM | POA: Insufficient documentation

## 2023-07-13 DIAGNOSIS — S199XXA Unspecified injury of neck, initial encounter: Secondary | ICD-10-CM | POA: Diagnosis present

## 2023-07-13 LAB — CBC
HCT: 48.6 % (ref 39.0–52.0)
Hemoglobin: 16.4 g/dL (ref 13.0–17.0)
MCH: 30.3 pg (ref 26.0–34.0)
MCHC: 33.7 g/dL (ref 30.0–36.0)
MCV: 89.7 fL (ref 80.0–100.0)
Platelets: 251 10*3/uL (ref 150–400)
RBC: 5.42 MIL/uL (ref 4.22–5.81)
RDW: 14.3 % (ref 11.5–15.5)
WBC: 9.3 10*3/uL (ref 4.0–10.5)
nRBC: 0 % (ref 0.0–0.2)
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
## 2023-07-14 ENCOUNTER — Emergency Department
Admission: EM | Admit: 2023-07-14 | Discharge: 2023-07-14 | Disposition: A | Discharge status: Home / Self Care | Payer: 59 | Attending: Emergency Medicine | Admitting: Emergency Medicine

## 2023-07-14 DIAGNOSIS — S39012A Strain of muscle, fascia and tendon of lower back, initial encounter: Secondary | ICD-10-CM

## 2023-07-14 DIAGNOSIS — S161XXA Strain of muscle, fascia and tendon at neck level, initial encounter: Secondary | ICD-10-CM

## 2023-07-14 DIAGNOSIS — M7918 Myalgia, other site: Secondary | ICD-10-CM

## 2023-07-14 LAB — BASIC METABOLIC PANEL
Anion gap: 14 (ref 5–15)
BUN: 10 mg/dL (ref 6–20)
CO2: 20 mmol/L — ABNORMAL LOW (ref 22–32)
Calcium: 8.4 mg/dL — ABNORMAL LOW (ref 8.9–10.3)
Chloride: 103 mmol/L (ref 98–111)
Creatinine, Ser: 0.84 mg/dL (ref 0.61–1.24)
GFR, Estimated: >60 mL/min (ref >60)
Glucose, Bld: 115 mg/dL — ABNORMAL HIGH (ref 70–99)
Potassium: 3.6 mmol/L (ref 3.5–5.1)
Sodium: 137 mmol/L (ref 135–145)

## 2023-07-14 LAB — TROPONIN I (HIGH SENSITIVITY): Troponin I (High Sensitivity): 10 ng/L (ref <18)

## 2023-07-14 MED ORDER — MUSCLE RUB 10-15 % EX CREA: 1.0000 | TOPICAL_CREAM | CUTANEOUS | 0 refills | Status: DC | PRN

## 2023-07-14 MED ORDER — LIDOCAINE 5 % EX PTCH: 1.0000 | MEDICATED_PATCH | Freq: Two times a day (BID) | CUTANEOUS | 0 refills | Status: AC

## 2023-07-14 NOTE — Discharge Instructions (Signed)
Take acetaminophen 650 mg and ibuprofen 400 mg every 6 hours for pain.  Take with food. Use muscle rub cream or pain patches as prescribed.  Thank you for choosing [place] for your health care today!  Please see your primary doctor this week for a follow up appointment.   If you have any new, worsening, or unexpected symptoms call your doctor right away or come back to the emergency department for reevaluation.  It was my pleasure to care for you today.   Daneil Dan Modesto Charon, MD
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
## 2023-08-29 ENCOUNTER — Other Ambulatory Visit: Payer: Self-pay | Admitting: Nurse Practitioner

## 2023-08-29 DIAGNOSIS — I10 Essential (primary) hypertension: Secondary | ICD-10-CM

## 2023-08-29 DIAGNOSIS — R030 Elevated blood-pressure reading, without diagnosis of hypertension: Secondary | ICD-10-CM

## 2023-08-31 NOTE — Telephone Encounter (Signed)
 Needs appt

## 2023-08-31 NOTE — Telephone Encounter (Signed)
 Call cannot be completed. Tried to call to schedule an appt

## 2023-08-31 NOTE — Telephone Encounter (Signed)
 Requested medication (s) are due for refill today:   Yes  Requested medication (s) are on the active medication list:   Yes  Future visit scheduled:   No   Had f/u appt for 06/28/2022 but was a No Show    LOV 06/01/2023   Last ordered: 06/01/2023 #30, 2 refills  Unable to refill because did not come to follow up appt.   Provider to review for refills.  Requested Prescriptions  Pending Prescriptions Disp Refills   hydrochlorothiazide (HYDRODIURIL) 12.5 MG tablet [Pharmacy Med Name: HYDROCHLOROTHIAZIDE 12.5 MG TB] 90 tablet     Sig: TAKE 1 TABLET BY MOUTH EVERY DAY     Cardiovascular: Diuretics - Thiazide Failed - 08/31/2023  8:39 AM      Failed - Last BP in normal range    BP Readings from Last 1 Encounters:  07/13/23 (!) 144/96         Failed - Valid encounter within last 6 months    Recent Outpatient Visits   None            Passed - Cr in normal range and within 180 days    Creat  Date Value Ref Range Status  06/08/2022 0.91 0.70 - 1.30 mg/dL Final   Creatinine, Ser  Date Value Ref Range Status  07/13/2023 0.84 0.61 - 1.24 mg/dL Final         Passed - K in normal range and within 180 days    Potassium  Date Value Ref Range Status  07/13/2023 3.6 3.5 - 5.1 mmol/L Final  09/20/2014 3.6 mmol/L Final    Comment:    3.5-5.1 NOTE: New Reference Range  08/05/14          Passed - Na in normal range and within 180 days    Sodium  Date Value Ref Range Status  07/13/2023 137 135 - 145 mmol/L Final  09/20/2014 139 mmol/L Final    Comment:    135-145 NOTE: New Reference Range  08/05/14

## 2023-09-02 ENCOUNTER — Other Ambulatory Visit: Payer: Self-pay

## 2023-09-02 ENCOUNTER — Emergency Department (HOSPITAL_COMMUNITY)
Admission: EM | Admit: 2023-09-02 | Discharge: 2023-09-02 | Disposition: A | Discharge status: Home / Self Care | Attending: Emergency Medicine | Admitting: Emergency Medicine

## 2023-09-02 ENCOUNTER — Encounter (HOSPITAL_COMMUNITY): Payer: Self-pay

## 2023-09-02 DIAGNOSIS — M25512 Pain in left shoulder: Secondary | ICD-10-CM | POA: Insufficient documentation

## 2023-09-02 DIAGNOSIS — Z720 Tobacco use: Secondary | ICD-10-CM | POA: Insufficient documentation

## 2023-09-02 DIAGNOSIS — Z79899 Other long term (current) drug therapy: Secondary | ICD-10-CM | POA: Insufficient documentation

## 2023-09-02 DIAGNOSIS — G8929 Other chronic pain: Secondary | ICD-10-CM | POA: Insufficient documentation

## 2023-09-02 DIAGNOSIS — I10 Essential (primary) hypertension: Secondary | ICD-10-CM | POA: Diagnosis not present

## 2023-09-02 MED ORDER — PREDNISONE 20 MG PO TABS: 40.0000 mg | ORAL_TABLET | Freq: Every day | ORAL | 0 refills | Status: DC

## 2023-09-02 MED ORDER — KETOROLAC TROMETHAMINE 15 MG/ML IJ SOLN: 15.0000 mg | Freq: Once | INTRAMUSCULAR | Status: DC

## 2023-09-02 MED ORDER — KETOROLAC TROMETHAMINE 60 MG/2ML IM SOLN: 60.0000 mg | Freq: Once | INTRAMUSCULAR | Status: DC

## 2023-09-02 MED ORDER — LIDOCAINE 5 % EX PTCH: 1.0000 | MEDICATED_PATCH | CUTANEOUS | 0 refills | Status: DC

## 2023-09-02 MED ORDER — HYDROCODONE-ACETAMINOPHEN 5-325 MG PO TABS
1.0000 | ORAL_TABLET | Freq: Once | ORAL | Status: AC
  Administered 2023-09-02: 1 via ORAL
  Filled 2023-09-02: qty 1

## 2023-09-02 MED ORDER — METHOCARBAMOL 500 MG PO TABS: 500.0000 mg | ORAL_TABLET | Freq: Two times a day (BID) | ORAL | 0 refills | Status: DC

## 2023-09-02 MED ORDER — KETOROLAC TROMETHAMINE 60 MG/2ML IM SOLN
15.0000 mg | Freq: Once | INTRAMUSCULAR | Status: AC
  Administered 2023-09-02: 15 mg via INTRAMUSCULAR
  Filled 2023-09-02: qty 2

## 2023-09-02 NOTE — Discharge Instructions (Addendum)
 Continue going to physical therapy and chiropractor.  Since you have already had an MRI and x-ray no repeat imaging was done in the emergency room today.  Please follow-up with primary care for further management and discuss referral to orthopedics if not already done.    I recommend is ice and heat gentle stretching.  Take steriods for 5 days. You can take anti-inflammatories and Tylenol 1000 mg every 6 hours.  You can try lidocaine patch over the area of pain. You can stop the Flexeril and try Robaxin. Robaxin is muscle relaxer and you can't take when driving or operating heavy machinery.
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
## 2023-09-14 ENCOUNTER — Other Ambulatory Visit: Payer: Self-pay | Admitting: Nurse Practitioner

## 2023-09-14 DIAGNOSIS — I10 Essential (primary) hypertension: Secondary | ICD-10-CM

## 2023-09-14 DIAGNOSIS — R030 Elevated blood-pressure reading, without diagnosis of hypertension: Secondary | ICD-10-CM

## 2023-09-15 NOTE — Telephone Encounter (Signed)
 Requested medications are due for refill today.  yes  Requested medications are on the active medications list.  yes  Last refill. 08/31/2023 #30 0 rf - a courtesy refill  Future visit scheduled.   no  Notes to clinic.  Pt no showed for follow up appt.     Requested Prescriptions  Pending Prescriptions Disp Refills   hydrochlorothiazide  (HYDRODIURIL ) 12.5 MG tablet [Pharmacy Med Name: HYDROCHLOROTHIAZIDE  12.5 MG TB] 90 tablet 1    Sig: TAKE 1 TABLET BY MOUTH EVERY DAY     Cardiovascular: Diuretics - Thiazide Failed - 09/15/2023  9:55 AM      Failed - Last BP in normal range    BP Readings from Last 1 Encounters:  09/02/23 (!) 150/100         Failed - Valid encounter within last 6 months    Recent Outpatient Visits   None            Passed - Cr in normal range and within 180 days    Creat  Date Value Ref Range Status  06/08/2022 0.91 0.70 - 1.30 mg/dL Final   Creatinine, Ser  Date Value Ref Range Status  07/13/2023 0.84 0.61 - 1.24 mg/dL Final         Passed - K in normal range and within 180 days    Potassium  Date Value Ref Range Status  07/13/2023 3.6 3.5 - 5.1 mmol/L Final  09/20/2014 3.6 mmol/L Final    Comment:    3.5-5.1 NOTE: New Reference Range  08/05/14          Passed - Na in normal range and within 180 days    Sodium  Date Value Ref Range Status  07/13/2023 137 135 - 145 mmol/L Final  09/20/2014 139 mmol/L Final    Comment:    135-145 NOTE: New Reference Range  08/05/14

## 2023-09-22 ENCOUNTER — Other Ambulatory Visit: Payer: Self-pay

## 2023-09-22 ENCOUNTER — Emergency Department (HOSPITAL_COMMUNITY)
Admission: EM | Admit: 2023-09-22 | Discharge: 2023-09-22 | Disposition: A | Discharge status: Home / Self Care | Attending: Emergency Medicine | Admitting: Emergency Medicine

## 2023-09-22 ENCOUNTER — Encounter (HOSPITAL_COMMUNITY): Payer: Self-pay | Admitting: Emergency Medicine

## 2023-09-22 ENCOUNTER — Emergency Department (HOSPITAL_COMMUNITY)

## 2023-09-22 DIAGNOSIS — R0789 Other chest pain: Secondary | ICD-10-CM | POA: Diagnosis not present

## 2023-09-22 DIAGNOSIS — M5410 Radiculopathy, site unspecified: Secondary | ICD-10-CM | POA: Insufficient documentation

## 2023-09-22 DIAGNOSIS — K219 Gastro-esophageal reflux disease without esophagitis: Secondary | ICD-10-CM

## 2023-09-22 DIAGNOSIS — M5412 Radiculopathy, cervical region: Secondary | ICD-10-CM | POA: Diagnosis not present

## 2023-09-22 DIAGNOSIS — M79602 Pain in left arm: Secondary | ICD-10-CM | POA: Diagnosis not present

## 2023-09-22 DIAGNOSIS — Z79899 Other long term (current) drug therapy: Secondary | ICD-10-CM | POA: Diagnosis not present

## 2023-09-22 DIAGNOSIS — R079 Chest pain, unspecified: Secondary | ICD-10-CM | POA: Insufficient documentation

## 2023-09-22 DIAGNOSIS — I10 Essential (primary) hypertension: Secondary | ICD-10-CM | POA: Insufficient documentation

## 2023-09-22 LAB — BASIC METABOLIC PANEL WITH GFR
Anion gap: 8 (ref 5–15)
BUN: 9 mg/dL (ref 6–20)
CO2: 22 mmol/L (ref 22–32)
Calcium: 8.5 mg/dL — ABNORMAL LOW (ref 8.9–10.3)
Chloride: 107 mmol/L (ref 98–111)
Creatinine, Ser: 0.77 mg/dL (ref 0.61–1.24)
GFR, Estimated: >60 mL/min (ref >60)
Glucose, Bld: 94 mg/dL (ref 70–99)
Potassium: 3.5 mmol/L (ref 3.5–5.1)
Sodium: 137 mmol/L (ref 135–145)

## 2023-09-22 LAB — CBC
HCT: 46.9 % (ref 39.0–52.0)
Hemoglobin: 16.2 g/dL (ref 13.0–17.0)
MCH: 30.1 pg (ref 26.0–34.0)
MCHC: 34.5 g/dL (ref 30.0–36.0)
MCV: 87 fL (ref 80.0–100.0)
Platelets: 201 10*3/uL (ref 150–400)
RBC: 5.39 MIL/uL (ref 4.22–5.81)
RDW: 14 % (ref 11.5–15.5)
WBC: 5.6 10*3/uL (ref 4.0–10.5)
nRBC: 0 % (ref 0.0–0.2)

## 2023-09-22 LAB — TROPONIN I (HIGH SENSITIVITY): Troponin I (High Sensitivity): 13 ng/L (ref <18)

## 2023-09-22 MED ORDER — PANTOPRAZOLE SODIUM 20 MG PO TBEC: 20.0000 mg | DELAYED_RELEASE_TABLET | Freq: Every day | ORAL | 1 refills | Status: DC | PRN

## 2023-09-22 MED ORDER — LIDOCAINE 5 % EX PTCH
1.0000 | MEDICATED_PATCH | CUTANEOUS | Status: DC
  Administered 2023-09-22: 1 via TRANSDERMAL
  Filled 2023-09-22: qty 1

## 2023-09-22 MED ORDER — METHYLPREDNISOLONE 4 MG PO TBPK: ORAL_TABLET | ORAL | 0 refills | Status: DC

## 2023-09-22 MED ORDER — KETOROLAC TROMETHAMINE 10 MG PO TABS: 10.0000 mg | ORAL_TABLET | Freq: Four times a day (QID) | ORAL | 0 refills | Status: DC | PRN

## 2023-09-22 MED ORDER — GABAPENTIN 300 MG PO CAPS: ORAL_CAPSULE | ORAL | 0 refills | Status: DC

## 2023-09-22 MED ORDER — HYDROCODONE-ACETAMINOPHEN 5-325 MG PO TABS
1.0000 | ORAL_TABLET | Freq: Once | ORAL | Status: AC
  Administered 2023-09-22: 1 via ORAL
  Filled 2023-09-22: qty 1
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
## 2023-09-22 NOTE — Discharge Instructions (Signed)
 You were seen for your arm pain in the emergency department.  It is likely from a pinched nerve.  At home, please take the steroid Dosepak, gabapentin, and Toradol  that we have prescribed you.  Please take the Protonix  while you are on the Toradol  and steroids to prevent ulcers.  Check your MyChart online for the results of any tests that had not resulted by the time you left the emergency department.   Follow-up with your primary doctor in 2-3 days regarding your visit.  Follow-up with pain management and the spine doctors soon as possible.  Return immediately to the emergency department if you experience any of the following: Worsening pain, numbness, or any other concerning symptoms.    Thank you for visiting our Emergency Department. It was a pleasure taking care of you today.

## 2023-11-08 ENCOUNTER — Emergency Department
Admission: EM | Admit: 2023-11-08 | Discharge: 2023-11-08 | Disposition: A | Discharge status: Home / Self Care | Attending: Emergency Medicine | Admitting: Emergency Medicine

## 2023-11-08 ENCOUNTER — Emergency Department

## 2023-11-08 ENCOUNTER — Other Ambulatory Visit: Payer: Self-pay

## 2023-11-08 DIAGNOSIS — R079 Chest pain, unspecified: Secondary | ICD-10-CM | POA: Diagnosis not present

## 2023-11-08 DIAGNOSIS — R051 Acute cough: Secondary | ICD-10-CM | POA: Diagnosis not present

## 2023-11-08 DIAGNOSIS — I10 Essential (primary) hypertension: Secondary | ICD-10-CM | POA: Diagnosis not present

## 2023-11-08 DIAGNOSIS — F17200 Nicotine dependence, unspecified, uncomplicated: Secondary | ICD-10-CM | POA: Insufficient documentation

## 2023-11-08 DIAGNOSIS — R0789 Other chest pain: Secondary | ICD-10-CM | POA: Diagnosis not present

## 2023-11-08 LAB — TROPONIN I (HIGH SENSITIVITY): Troponin I (High Sensitivity): 8 ng/L (ref <18)

## 2023-11-08 LAB — CBC
HCT: 49.7 % (ref 39.0–52.0)
Hemoglobin: 17.2 g/dL — ABNORMAL HIGH (ref 13.0–17.0)
MCH: 29.8 pg (ref 26.0–34.0)
MCHC: 34.6 g/dL (ref 30.0–36.0)
MCV: 86 fL (ref 80.0–100.0)
Platelets: 221 10*3/uL (ref 150–400)
RBC: 5.78 MIL/uL (ref 4.22–5.81)
RDW: 14 % (ref 11.5–15.5)
WBC: 6.5 10*3/uL (ref 4.0–10.5)
nRBC: 0 % (ref 0.0–0.2)

## 2023-11-08 LAB — BASIC METABOLIC PANEL WITH GFR
Anion gap: 9 (ref 5–15)
BUN: 7 mg/dL (ref 6–20)
CO2: 22 mmol/L (ref 22–32)
Calcium: 9 mg/dL (ref 8.9–10.3)
Chloride: 107 mmol/L (ref 98–111)
Creatinine, Ser: 0.83 mg/dL (ref 0.61–1.24)
GFR, Estimated: >60 mL/min (ref >60)
Glucose, Bld: 140 mg/dL — ABNORMAL HIGH (ref 70–99)
Potassium: 3.7 mmol/L (ref 3.5–5.1)
Sodium: 138 mmol/L (ref 135–145)

## 2023-11-08 MED ORDER — LIDOCAINE 5 % EX PTCH: 1.0000 | MEDICATED_PATCH | Freq: Two times a day (BID) | CUTANEOUS | 0 refills | Status: AC

## 2023-11-08 MED ORDER — LIDOCAINE 5 % EX PTCH
1.0000 | MEDICATED_PATCH | CUTANEOUS | Status: DC
  Administered 2023-11-08: 1 via TRANSDERMAL
  Filled 2023-11-08: qty 1

## 2023-11-08 NOTE — Discharge Instructions (Addendum)
 Please follow-up with your outpatient provider.  Did not wish to have further testing today.  Please return for any new, worsening, or change in symptoms or other concerns.  It was a pleasure caring for you today.

## 2023-11-08 NOTE — ED Notes (Signed)
 Patient Alert and oriented to baseline. Stable and ambulatory to baseline. Patient verbalized understanding of the discharge instructions.  Patient belongings were taken by the patient.

## 2023-11-08 NOTE — ED Provider Notes (Signed)
 [facility] Provider Note    Event Date/Time   First MD Initiated Contact with Patient 11/08/23 [phone]     (approximate)   History   Cough   HPI  [facility] is a 53 y.o. male with a past medical history of tobacco use, hypertension, hyperlipidemia, depression who presents today for evaluation of right sided chest wall pain.  Patient reports that this has been ongoing for 3 days, ever since he developed a cough.  No hemoptysis.  Cough is dry.  Reports that his chest pain is worsened with truncal rotation and with coughing.  No fevers or chills.  No dizziness or headaches.  No paresthesias.  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   History of hypertension 12/01/2021   Tobacco dependence 12/01/2021   Obesity (BMI 30-39.9) 12/01/2021          Physical Exam   Triage Vital Signs: ED Triage Vitals  Encounter Vitals Group     BP 11/08/23 0848 (!) 155/100     Systolic BP Percentile --      Diastolic BP Percentile --      Pulse Rate 11/08/23 0848 73     Resp 11/08/23 0848 20     Temp 11/08/23 0848 98.5 F (36.9 C)     Temp Source 11/08/23 0848 Oral     SpO2 11/08/23 0848 97 %     Weight --      Height --      Head Circumference --      Peak Flow --      Pain Score 11/08/23 0849 10     Pain Loc --      Pain Education --      Exclude from Growth Chart --     Most recent vital signs: Vitals:   11/08/23 0848  BP: (!) 155/100  Pulse: 73  Resp: 20  Temp: 98.5 F (36.9 C)  SpO2: 97%    Physical Exam Vitals and nursing note reviewed.  Constitutional:      General: Awake and alert. No acute distress.    Appearance: Normal appearance. The patient is normal weight.  HENT:     Head: Normocephalic and atraumatic.     Mouth:  Mucous membranes are moist.  Eyes:     General: PERRL. Normal EOMs        Right eye: No discharge.        Left eye: No discharge.     Conjunctiva/sclera: Conjunctivae normal.  Cardiovascular:     Rate and Rhythm: Normal rate and regular rhythm.     Pulses: Normal pulses.  Equal in all 4 extremities    Heart sounds: Normal heart sounds Pulmonary:     Effort: Pulmonary effort is normal. No respiratory distress.     Breath sounds: Normal breath sounds.  Right-sided chest wall tenderness to palpation without rash or skin changes noted. Abdominal:     Abdomen is soft. There is no abdominal tenderness. No rebound or guarding. No distention. Musculoskeletal:        General: No swelling. Normal range of motion.     Cervical back: Normal range of motion and neck supple.  Skin:    General: Skin is warm and dry.     Capillary Refill: Capillary refill  takes less than 2 seconds.     Findings: No rash.  Neurological:     Mental Status: The patient is awake and alert.      ED Results / Procedures / Treatments   Labs (all labs ordered are listed, but only abnormal results are displayed) Labs Reviewed  BASIC METABOLIC PANEL WITH GFR - Abnormal; Notable for the following components:      Result Value   Glucose, Bld 140 (*)    All other components within normal limits  CBC - Abnormal; Notable for the following components:   Hemoglobin 17.2 (*)    All other components within normal limits  TROPONIN I (HIGH SENSITIVITY)     EKG     RADIOLOGY I independently reviewed and interpreted imaging and agree with radiologists findings.     PROCEDURES:  Critical Care performed:   Procedures   MEDICATIONS ORDERED IN ED: Medications  lidocaine  (LIDODERM ) 5 % 1 patch (1 patch Transdermal Patch Applied 11/08/23 0935)     IMPRESSION / MDM / ASSESSMENT AND PLAN / ED COURSE  I reviewed the triage vital signs and the nursing notes.   Differential diagnosis includes, but is not limited  to, costochondritis, chest wall pain, pleurisy, mass.  Patient is awake and alert, hemodynamically stable and afebrile.  He has normal oxygen saturation of 97% on room air.  Patient's pain is easily reproducible with palpation and truncal rotation, raising suspicion for a chest wall etiology.  No tachycardia, hypoxia, hemoptysis, exogenous hormone use, recent trips or travel, clinical signs or symptoms of DVT to suggest PE as a source of his symptoms today.  Wells score 0.   I reviewed the patient's chart.  Patient had a visit on 07/21/2022 with similar complaints on opposite side.  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  I discussed this with the patient and recommended that he undergo further imaging today with a CT angiogram, however patient declined.  Clinically he is well-appearing and has equal and normal pulses in all 4 extremities, no chest pain or back pain.  His chest wall pain is easily reproducible with palpation and truncal rotation, likely exacerbated by his current cough.  Patient does not wish to stay for further imaging.  I emphasized the importance of close outpatient follow-up given this abnormal finding.  We discussed very strict return precautions in the meantime.  Patient understands and agrees with plan.  He was discharged in stable condition.  Patient's presentation is most consistent with acute presentation with potential threat to life or bodily function.     FINAL CLINICAL IMPRESSION(S) / ED DIAGNOSES   Final diagnoses:  Chest wall pain  Acute cough     Rx / DC Orders   ED Discharge Orders          Ordered    lidocaine  (LIDODERM ) 5 %  Every 12 hours        11/08/23 0959             Note:  This document was prepared using Dragon voice recognition software and may include unintentional dictation errors.   Keyerra Lamere E, PA-C 11/08/23 1343    Bryson Carbine,  MD 11/08/23 1413

## 2023-11-08 NOTE — ED Triage Notes (Signed)
 Pt to ED via POV from home. Pt ambulatory to triage. Pt reports right sided rib pain with radiation to chest x2 days. Pt also reports cough x2 days. Everyday smoker.

## 2023-11-30 ENCOUNTER — Other Ambulatory Visit: Payer: Self-pay

## 2023-11-30 ENCOUNTER — Emergency Department
Admission: EM | Admit: 2023-11-30 | Discharge: 2023-11-30 | Disposition: A | Discharge status: Home / Self Care | Attending: Emergency Medicine | Admitting: Emergency Medicine

## 2023-11-30 DIAGNOSIS — X58XXXA Exposure to other specified factors, initial encounter: Secondary | ICD-10-CM | POA: Diagnosis not present

## 2023-11-30 DIAGNOSIS — S27819A Unspecified injury of esophagus (thoracic part), initial encounter: Secondary | ICD-10-CM | POA: Diagnosis present

## 2023-11-30 DIAGNOSIS — S27818A Other injury of esophagus (thoracic part), initial encounter: Secondary | ICD-10-CM | POA: Diagnosis not present

## 2023-11-30 DIAGNOSIS — S1011XA Abrasion of throat, initial encounter: Secondary | ICD-10-CM | POA: Diagnosis not present

## 2023-11-30 MED ORDER — LIDOCAINE VISCOUS HCL 2 % MT SOLN: 5.0000 mL | Freq: Three times a day (TID) | OROMUCOSAL | 0 refills | Status: DC

## 2023-11-30 NOTE — ED Provider Notes (Deleted)
 CT HEAD ORDERED DUE TO FALL AND HEAD INJURY   Gasper Devere ORN, PA-C 11/30/23 1111

## 2023-11-30 NOTE — ED Triage Notes (Signed)
 States pulled a sandwich tie out of throat 3 days ago, c/o sore throat.  AAOx3. Skin warm and dry. NAD

## 2023-11-30 NOTE — ED Provider Notes (Signed)
 [facility] Provider Note    Event Date/Time   First MD Initiated Contact with Patient 11/30/23 1100     (approximate)   History   Sore Throat   HPI  [facility] is a 53 y.o. male with no significant past medical history presents emergency department stating that he bit into a hot dog and had a sandwich try and he spit it out.  States he thinks it might of scratched his throat a little bit.  However he did not swallow that the tie.  States throat is just a little scratchy.  No bleeding.  No chest pain shortness of breath      Physical Exam   Triage Vital Signs: ED Triage Vitals  Encounter Vitals Group     BP 11/30/23 1057 (!) 147/99     Girls Systolic BP Percentile --      Girls Diastolic BP Percentile --      Boys Systolic BP Percentile --      Boys Diastolic BP Percentile --      Pulse Rate 11/30/23 1057 80     Resp 11/30/23 1057 18     Temp 11/30/23 1057 98.5 F (36.9 C)     Temp Source 11/30/23 1057 Oral     SpO2 11/30/23 1057 98 %     Weight 11/30/23 1050 197 lb 15.6 oz (89.8 kg)     Height 11/30/23 1057 5' 6 (1.676 m)     Head Circumference --      Peak Flow --      Pain Score 11/30/23 1049 3     Pain Loc --      Pain Education --      Exclude from Growth Chart --     Most recent vital signs: Vitals:   11/30/23 1057  BP: (!) 147/99  Pulse: 80  Resp: 18  Temp: 98.5 F (36.9 C)  SpO2: 98%     General: Awake, no distress.   CV:  Good peripheral perfusion.  Resp:  Normal effort.  Abd:  No distention.   Other:  Throat with no abrasions or active bleeding noted, slight irritation noted, neck supple, no lymphadenopathy   ED Results / Procedures / Treatments   Labs (all labs ordered are listed, but only abnormal results are displayed) Labs Reviewed - No data to display   EKG     RADIOLOGY     PROCEDURES:   Procedures  Critical Care:  no Chief Complaint  Patient presents with   Sore Throat       MEDICATIONS ORDERED IN ED: Medications - No data to display   IMPRESSION / MDM / ASSESSMENT AND PLAN / ED COURSE  I reviewed the triage vital signs and the nursing notes.                              Differential diagnosis includes, but is not limited to, esophageal abrasion, swallowed foreign body, perforation  Patient's presentation is most consistent with acute illness / injury with system symptoms.   Patient's exam is reassuring gave him reassurance.  Will give him Magic mouthwash for discomfort.  He is to do salt water gargles.  Patient is in agreement treatment plan.  Discharged over the      FINAL CLINICAL IMPRESSION(S) / ED DIAGNOSES   Final diagnoses:  Abrasion of esophagus, initial encounter     Rx / DC Orders  ED Discharge Orders          Ordered    magic mouthwash (lidocaine , diphenhydrAMINE, alum & mag hydroxide) suspension  3 times daily       Note to Pharmacy: Equal parts all ingrediants   11/30/23 1125             Note:  This document was prepared using Dragon voice recognition software and may include unintentional dictation errors.    Gasper Devere ORN, PA-C 11/30/23 1145    Levander Slate, MD 11/30/23 [phone]

## 2023-12-08 ENCOUNTER — Encounter: Payer: Self-pay | Admitting: Nurse Practitioner

## 2023-12-08 ENCOUNTER — Ambulatory Visit (INDEPENDENT_AMBULATORY_CARE_PROVIDER_SITE_OTHER): Admitting: Nurse Practitioner

## 2023-12-08 VITALS — BP 148/104 | HR 102 | Temp 98.1°F | Ht 66.0 in | Wt 192.0 lb

## 2023-12-08 DIAGNOSIS — J40 Bronchitis, not specified as acute or chronic: Secondary | ICD-10-CM

## 2023-12-08 DIAGNOSIS — M25512 Pain in left shoulder: Secondary | ICD-10-CM

## 2023-12-08 DIAGNOSIS — G8929 Other chronic pain: Secondary | ICD-10-CM | POA: Diagnosis not present

## 2023-12-08 DIAGNOSIS — M542 Cervicalgia: Secondary | ICD-10-CM | POA: Diagnosis not present

## 2023-12-08 DIAGNOSIS — R062 Wheezing: Secondary | ICD-10-CM | POA: Diagnosis not present

## 2023-12-08 DIAGNOSIS — M545 Low back pain, unspecified: Secondary | ICD-10-CM | POA: Diagnosis not present

## 2023-12-08 DIAGNOSIS — I10 Essential (primary) hypertension: Secondary | ICD-10-CM | POA: Diagnosis not present

## 2023-12-08 DIAGNOSIS — R030 Elevated blood-pressure reading, without diagnosis of hypertension: Secondary | ICD-10-CM | POA: Diagnosis not present

## 2023-12-08 MED ORDER — HYDROCHLOROTHIAZIDE 12.5 MG PO TABS: 12.5000 mg | ORAL_TABLET | Freq: Every day | ORAL | 0 refills | Status: AC

## 2023-12-08 MED ORDER — ALBUTEROL SULFATE HFA 108 (90 BASE) MCG/ACT IN AERS: 2.0000 | INHALATION_SPRAY | RESPIRATORY_TRACT | 5 refills | Status: AC | PRN

## 2023-12-08 MED ORDER — DULOXETINE HCL 30 MG PO CPEP: 30.0000 mg | ORAL_CAPSULE | Freq: Every day | ORAL | 0 refills | Status: DC
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
## 2024-01-01 ENCOUNTER — Other Ambulatory Visit: Payer: Self-pay | Admitting: Nurse Practitioner

## 2024-01-01 DIAGNOSIS — G8929 Other chronic pain: Secondary | ICD-10-CM

## 2024-01-02 NOTE — Telephone Encounter (Signed)
 Requested Prescriptions  Pending Prescriptions Disp Refills   DULoxetine  (CYMBALTA ) 30 MG capsule [Pharmacy Med Name: DULOXETINE  HCL DR 30 MG CAP] 90 capsule 0    Sig: TAKE 1 CAPSULE BY MOUTH EVERY DAY     Psychiatry: Antidepressants - SNRI - duloxetine  Failed - 01/02/2024  2:06 PM      Failed - Last BP in normal range    BP Readings from Last 1 Encounters:  12/08/23 (!) 148/104         Passed - Cr in normal range and within 360 days    Creat  Date Value Ref Range Status  06/08/2022 0.91 0.70 - 1.30 mg/dL Final   Creatinine, Ser  Date Value Ref Range Status  11/08/2023 0.83 0.61 - 1.24 mg/dL Final         Passed - eGFR is 30 or above and within 360 days    EGFR (African American)  Date Value Ref Range Status  09/20/2014 >60  Final   EGFR (Non-African Amer.)  Date Value Ref Range Status  09/20/2014 >60  Final    Comment:    eGFR values <35mL/min/1.73 m2 may be an indication of chronic kidney disease (CKD). Calculated eGFR is useful in patients with stable renal function. The eGFR calculation will not be reliable in acutely ill patients when serum creatinine is changing rapidly. It is not useful in patients on dialysis. The eGFR calculation may not be applicable to patients at the low and high extremes of body sizes, pregnant women, and vegetarians.    GFR, Estimated  Date Value Ref Range Status  11/08/2023 >60 >60 mL/min Final    Comment:    (NOTE) Calculated using the CKD-EPI Creatinine Equation (2021)    eGFR  Date Value Ref Range Status  06/08/2022 101 > OR = 60 mL/min/1.8m2 Final         Passed - Completed PHQ-2 or PHQ-9 in the last 360 days      Passed - Valid encounter within last 6 months    Recent Outpatient Visits           3 weeks ago Chronic left shoulder pain   [facility] Gareth Mliss [place], [place]

## 2024-01-09 ENCOUNTER — Emergency Department

## 2024-01-09 ENCOUNTER — Other Ambulatory Visit: Payer: Self-pay

## 2024-01-09 DIAGNOSIS — M79602 Pain in left arm: Secondary | ICD-10-CM | POA: Diagnosis not present

## 2024-01-09 DIAGNOSIS — Z5321 Procedure and treatment not carried out due to patient leaving prior to being seen by health care provider: Secondary | ICD-10-CM | POA: Diagnosis not present

## 2024-01-09 DIAGNOSIS — R079 Chest pain, unspecified: Secondary | ICD-10-CM | POA: Diagnosis not present

## 2024-01-09 LAB — CBC
HCT: 45.7 % (ref 39.0–52.0)
Hemoglobin: 15.6 g/dL (ref 13.0–17.0)
MCH: 30.1 pg (ref 26.0–34.0)
MCHC: 34.1 g/dL (ref 30.0–36.0)
MCV: 88.1 fL (ref 80.0–100.0)
Platelets: 264 K/uL (ref 150–400)
RBC: 5.19 MIL/uL (ref 4.22–5.81)
RDW: 14.3 % (ref 11.5–15.5)
WBC: 8.2 K/uL (ref 4.0–10.5)
nRBC: 0 % (ref 0.0–0.2)

## 2024-01-09 NOTE — ED Triage Notes (Signed)
 T reports chest pain and left arm pain x3 days, pt reports pain is worse with movement of arm. Pt denies SOB or feeling dizzy

## 2024-01-10 ENCOUNTER — Emergency Department
Admission: EM | Admit: 2024-01-10 | Discharge: 2024-01-10 | Discharge status: Against Medical Advice | Attending: Emergency Medicine | Admitting: Emergency Medicine

## 2024-01-10 DIAGNOSIS — R079 Chest pain, unspecified: Secondary | ICD-10-CM | POA: Diagnosis not present

## 2024-01-10 LAB — BASIC METABOLIC PANEL WITH GFR
Anion gap: 10 (ref 5–15)
BUN: 9 mg/dL (ref 6–20)
CO2: 22 mmol/L (ref 22–32)
Calcium: 8.5 mg/dL — ABNORMAL LOW (ref 8.9–10.3)
Chloride: 109 mmol/L (ref 98–111)
Creatinine, Ser: 1.14 mg/dL (ref 0.61–1.24)
GFR, Estimated: >60 mL/min (ref >60)
Glucose, Bld: 99 mg/dL (ref 70–99)
Potassium: 4 mmol/L (ref 3.5–5.1)
Sodium: 141 mmol/L (ref 135–145)

## 2024-01-10 LAB — TROPONIN I (HIGH SENSITIVITY)
Troponin I (High Sensitivity): 12 ng/L (ref <18)
Troponin I (High Sensitivity): 11 ng/L (ref <18)

## 2024-01-17 ENCOUNTER — Other Ambulatory Visit: Payer: Self-pay

## 2024-01-17 ENCOUNTER — Emergency Department
Admission: EM | Admit: 2024-01-17 | Discharge: 2024-01-17 | Disposition: A | Discharge status: Home / Self Care | Attending: Emergency Medicine | Admitting: Emergency Medicine

## 2024-01-17 ENCOUNTER — Encounter: Payer: Self-pay | Admitting: Emergency Medicine

## 2024-01-17 ENCOUNTER — Emergency Department

## 2024-01-17 DIAGNOSIS — F17210 Nicotine dependence, cigarettes, uncomplicated: Secondary | ICD-10-CM | POA: Insufficient documentation

## 2024-01-17 DIAGNOSIS — M25512 Pain in left shoulder: Secondary | ICD-10-CM | POA: Insufficient documentation

## 2024-01-17 DIAGNOSIS — Z79899 Other long term (current) drug therapy: Secondary | ICD-10-CM | POA: Insufficient documentation

## 2024-01-17 DIAGNOSIS — F17200 Nicotine dependence, unspecified, uncomplicated: Secondary | ICD-10-CM

## 2024-01-17 DIAGNOSIS — I10 Essential (primary) hypertension: Secondary | ICD-10-CM | POA: Insufficient documentation

## 2024-01-17 DIAGNOSIS — R079 Chest pain, unspecified: Secondary | ICD-10-CM | POA: Diagnosis not present

## 2024-01-17 DIAGNOSIS — R0789 Other chest pain: Secondary | ICD-10-CM | POA: Diagnosis not present

## 2024-01-17 LAB — HEPATIC FUNCTION PANEL
ALT: 17 U/L (ref 0–44)
AST: 21 U/L (ref 15–41)
Albumin: 3.8 g/dL (ref 3.5–5.0)
Alkaline Phosphatase: 54 U/L (ref 38–126)
Bilirubin, Direct: <0.1 mg/dL (ref 0.0–0.2)
Total Bilirubin: 0.4 mg/dL (ref 0.0–1.2)
Total Protein: 6.6 g/dL (ref 6.5–8.1)

## 2024-01-17 LAB — BASIC METABOLIC PANEL WITH GFR
Anion gap: 9 (ref 5–15)
BUN: 10 mg/dL (ref 6–20)
CO2: 22 mmol/L (ref 22–32)
Calcium: 8.6 mg/dL — ABNORMAL LOW (ref 8.9–10.3)
Chloride: 108 mmol/L (ref 98–111)
Creatinine, Ser: 0.76 mg/dL (ref 0.61–1.24)
GFR, Estimated: >60 mL/min (ref >60)
Glucose, Bld: 97 mg/dL (ref 70–99)
Potassium: 4.1 mmol/L (ref 3.5–5.1)
Sodium: 139 mmol/L (ref 135–145)

## 2024-01-17 LAB — CBC
HCT: 46.1 % (ref 39.0–52.0)
Hemoglobin: 15.7 g/dL (ref 13.0–17.0)
MCH: 30 pg (ref 26.0–34.0)
MCHC: 34.1 g/dL (ref 30.0–36.0)
MCV: 88.1 fL (ref 80.0–100.0)
Platelets: 215 K/uL (ref 150–400)
RBC: 5.23 MIL/uL (ref 4.22–5.81)
RDW: 14.5 % (ref 11.5–15.5)
WBC: 7 K/uL (ref 4.0–10.5)
nRBC: 0 % (ref 0.0–0.2)

## 2024-01-17 LAB — TROPONIN I (HIGH SENSITIVITY): Troponin I (High Sensitivity): 9 ng/L (ref <18)

## 2024-01-17 MED ORDER — ACETAMINOPHEN 500 MG PO TABS
1000.0000 mg | ORAL_TABLET | Freq: Once | ORAL | Status: AC
  Administered 2024-01-17: 1000 mg via ORAL
  Filled 2024-01-17: qty 2

## 2024-01-17 MED ORDER — ASPIRIN 81 MG PO CHEW
324.0000 mg | CHEWABLE_TABLET | Freq: Once | ORAL | Status: AC
  Administered 2024-01-17: 324 mg via ORAL
  Filled 2024-01-17: qty 4

## 2024-01-17 NOTE — ED Triage Notes (Signed)
 Patient to ED via POV for chest pain- ongoing x1 week. States pain across chest and radiates into upper back and left arm. Denies cardiac hx. Seen for same on 8/13. Hx HTN- has not taken meds for same today.

## 2024-01-17 NOTE — ED Notes (Signed)
 DC instructions reviewed thoroughly with patient. No questions or concerns at this time. Decline wheelchair ambulated out of ED with steady gait. Will follow up with cardiology.
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
## 2024-01-17 NOTE — Discharge Instructions (Addendum)
 You were seen in the emergency department for chest pain.  You had a normal chest x-ray, EKG and your heart enzymes were normal today.  It is importantly follow-up closely with your primary care physician.  You are given information and a referral for cardiology for your further evaluation of your heart.  It is importantly return to the emergency department if you have any ongoing or worsening symptoms.  Pain control:  Ibuprofen (motrin/aleve karolyn) - You can take 3 tablets (600 mg) every 6 hours as needed for pain/fever.  Acetaminophen  (tylenol ) - You can take 2 extra strength tablets (1000 mg) every 6 hours as needed for pain/fever.  You can alternate these medications or take them together.  Make sure you eat food/drink water when taking these medications.

## 2024-01-25 ENCOUNTER — Other Ambulatory Visit: Payer: Self-pay

## 2024-01-25 ENCOUNTER — Emergency Department
Admission: EM | Admit: 2024-01-25 | Discharge: 2024-01-25 | Disposition: A | Discharge status: Home / Self Care | Attending: Emergency Medicine | Admitting: Emergency Medicine

## 2024-01-25 DIAGNOSIS — M25512 Pain in left shoulder: Secondary | ICD-10-CM | POA: Diagnosis not present

## 2024-01-25 DIAGNOSIS — I10 Essential (primary) hypertension: Secondary | ICD-10-CM | POA: Diagnosis not present

## 2024-01-25 DIAGNOSIS — G8929 Other chronic pain: Secondary | ICD-10-CM | POA: Insufficient documentation

## 2024-01-25 MED ORDER — OXYCODONE-ACETAMINOPHEN 7.5-325 MG PO TABS: 1.0000 | ORAL_TABLET | ORAL | 0 refills | Status: DC | PRN

## 2024-01-25 MED ORDER — KETOROLAC TROMETHAMINE 30 MG/ML IJ SOLN
30.0000 mg | Freq: Once | INTRAMUSCULAR | Status: AC
  Administered 2024-01-25: 30 mg via INTRAMUSCULAR
  Filled 2024-01-25: qty 1

## 2024-01-25 NOTE — Discharge Instructions (Signed)
 Follow-up with pain management for chronic left shoulder pain.
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
## 2024-02-28 ENCOUNTER — Ambulatory Visit: Attending: Cardiology | Admitting: Cardiology

## 2024-04-12 NOTE — ED Triage Notes (Signed)
 Presenting with left shoulder pain that has been ongoing for months he was given steroid shots which would help a little bit. Also reporting chest pain and congestion.

## 2024-04-12 NOTE — ED Triage Notes (Signed)
 Reporting one day of chest, abdomen, and left shoulder pain

## 2024-04-13 ENCOUNTER — Other Ambulatory Visit: Payer: Self-pay

## 2024-04-13 ENCOUNTER — Emergency Department (HOSPITAL_COMMUNITY)
Admission: EM | Admit: 2024-04-13 | Discharge: 2024-04-13 | Disposition: A | Discharge status: Home / Self Care | Attending: Emergency Medicine | Admitting: Emergency Medicine

## 2024-04-13 ENCOUNTER — Encounter (HOSPITAL_COMMUNITY): Payer: Self-pay

## 2024-04-13 DIAGNOSIS — M25512 Pain in left shoulder: Secondary | ICD-10-CM | POA: Insufficient documentation

## 2024-04-13 DIAGNOSIS — G8929 Other chronic pain: Secondary | ICD-10-CM | POA: Diagnosis not present

## 2024-04-13 DIAGNOSIS — Z79899 Other long term (current) drug therapy: Secondary | ICD-10-CM | POA: Insufficient documentation

## 2024-04-13 DIAGNOSIS — R110 Nausea: Secondary | ICD-10-CM | POA: Diagnosis not present

## 2024-04-13 DIAGNOSIS — M791 Myalgia, unspecified site: Secondary | ICD-10-CM | POA: Diagnosis not present

## 2024-04-13 MED ORDER — KETOROLAC TROMETHAMINE 30 MG/ML IJ SOLN
30.0000 mg | Freq: Once | INTRAMUSCULAR | Status: AC
  Administered 2024-04-13: 30 mg via INTRAMUSCULAR
  Filled 2024-04-13: qty 1

## 2024-04-13 MED ORDER — OXYCODONE-ACETAMINOPHEN 5-325 MG PO TABS: 1.0000 | ORAL_TABLET | ORAL | 0 refills | Status: AC | PRN
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
